# Patient Record
Sex: Female | Born: 1945 | Race: Black or African American | Hispanic: No | Smoking: Former smoker
Health system: Southern US, Community
[De-identification: ages and names within clinical notes are randomized; demographics above are authoritative.]

## PROBLEM LIST (undated history)

## (undated) DIAGNOSIS — I5030 Unspecified diastolic (congestive) heart failure: Secondary | ICD-10-CM

## (undated) DIAGNOSIS — K219 Gastro-esophageal reflux disease without esophagitis: Secondary | ICD-10-CM

## (undated) DIAGNOSIS — I10 Essential (primary) hypertension: Secondary | ICD-10-CM

## (undated) DIAGNOSIS — J449 Chronic obstructive pulmonary disease, unspecified: Secondary | ICD-10-CM

## (undated) DIAGNOSIS — IMO0001 Reserved for inherently not codable concepts without codable children: Secondary | ICD-10-CM

## (undated) DIAGNOSIS — I272 Pulmonary hypertension, unspecified: Secondary | ICD-10-CM

## (undated) DIAGNOSIS — I219 Acute myocardial infarction, unspecified: Secondary | ICD-10-CM

## (undated) DIAGNOSIS — M1990 Unspecified osteoarthritis, unspecified site: Secondary | ICD-10-CM

## (undated) DIAGNOSIS — W3400XA Accidental discharge from unspecified firearms or gun, initial encounter: Secondary | ICD-10-CM

## (undated) HISTORY — PX: BACK SURGERY: SHX140

## (undated) HISTORY — PX: EYE SURGERY: SHX253

## (undated) HISTORY — PX: CORONARY ANGIOPLASTY: SHX604

---

## 2013-12-03 ENCOUNTER — Inpatient Hospital Stay (HOSPITAL_COMMUNITY)
Admission: AD | Admit: 2013-12-03 | Discharge: 2013-12-09 | DRG: 371 | Disposition: A | Discharge status: Home Health | Payer: Medicare Other | Source: Other Acute Inpatient Hospital | Attending: Internal Medicine | Admitting: Internal Medicine

## 2013-12-03 DIAGNOSIS — D696 Thrombocytopenia, unspecified: Secondary | ICD-10-CM | POA: Diagnosis present

## 2013-12-03 DIAGNOSIS — Z9981 Dependence on supplemental oxygen: Secondary | ICD-10-CM

## 2013-12-03 DIAGNOSIS — J984 Other disorders of lung: Secondary | ICD-10-CM | POA: Diagnosis present

## 2013-12-03 DIAGNOSIS — K219 Gastro-esophageal reflux disease without esophagitis: Secondary | ICD-10-CM | POA: Diagnosis present

## 2013-12-03 DIAGNOSIS — R197 Diarrhea, unspecified: Secondary | ICD-10-CM | POA: Diagnosis not present

## 2013-12-03 DIAGNOSIS — K8080 Other cholelithiasis without obstruction: Secondary | ICD-10-CM | POA: Diagnosis present

## 2013-12-03 DIAGNOSIS — R1011 Right upper quadrant pain: Secondary | ICD-10-CM | POA: Diagnosis present

## 2013-12-03 DIAGNOSIS — I248 Other forms of acute ischemic heart disease: Secondary | ICD-10-CM | POA: Diagnosis present

## 2013-12-03 DIAGNOSIS — I5030 Unspecified diastolic (congestive) heart failure: Secondary | ICD-10-CM | POA: Diagnosis present

## 2013-12-03 DIAGNOSIS — J9611 Chronic respiratory failure with hypoxia: Secondary | ICD-10-CM

## 2013-12-03 DIAGNOSIS — I420 Dilated cardiomyopathy: Secondary | ICD-10-CM | POA: Diagnosis present

## 2013-12-03 DIAGNOSIS — J9622 Acute and chronic respiratory failure with hypercapnia: Secondary | ICD-10-CM | POA: Diagnosis present

## 2013-12-03 DIAGNOSIS — I252 Old myocardial infarction: Secondary | ICD-10-CM

## 2013-12-03 DIAGNOSIS — N189 Chronic kidney disease, unspecified: Secondary | ICD-10-CM | POA: Diagnosis present

## 2013-12-03 DIAGNOSIS — E872 Acidosis: Secondary | ICD-10-CM | POA: Diagnosis present

## 2013-12-03 DIAGNOSIS — Z79899 Other long term (current) drug therapy: Secondary | ICD-10-CM | POA: Diagnosis not present

## 2013-12-03 DIAGNOSIS — R0689 Other abnormalities of breathing: Secondary | ICD-10-CM | POA: Clinically undetermined

## 2013-12-03 DIAGNOSIS — I272 Other secondary pulmonary hypertension: Secondary | ICD-10-CM | POA: Diagnosis present

## 2013-12-03 DIAGNOSIS — N179 Acute kidney failure, unspecified: Secondary | ICD-10-CM | POA: Diagnosis present

## 2013-12-03 DIAGNOSIS — M19072 Primary osteoarthritis, left ankle and foot: Secondary | ICD-10-CM | POA: Diagnosis present

## 2013-12-03 DIAGNOSIS — J441 Chronic obstructive pulmonary disease with (acute) exacerbation: Secondary | ICD-10-CM | POA: Diagnosis present

## 2013-12-03 DIAGNOSIS — Z7982 Long term (current) use of aspirin: Secondary | ICD-10-CM

## 2013-12-03 DIAGNOSIS — I129 Hypertensive chronic kidney disease with stage 1 through stage 4 chronic kidney disease, or unspecified chronic kidney disease: Secondary | ICD-10-CM | POA: Diagnosis present

## 2013-12-03 DIAGNOSIS — I313 Pericardial effusion (noninflammatory): Secondary | ICD-10-CM | POA: Diagnosis present

## 2013-12-03 DIAGNOSIS — J810 Acute pulmonary edema: Secondary | ICD-10-CM | POA: Diagnosis present

## 2013-12-03 DIAGNOSIS — K8050 Calculus of bile duct without cholangitis or cholecystitis without obstruction: Secondary | ICD-10-CM | POA: Diagnosis present

## 2013-12-03 DIAGNOSIS — A047 Enterocolitis due to Clostridium difficile: Secondary | ICD-10-CM | POA: Diagnosis present

## 2013-12-03 DIAGNOSIS — Z6841 Body Mass Index (BMI) 40.0 and over, adult: Secondary | ICD-10-CM

## 2013-12-03 DIAGNOSIS — M109 Gout, unspecified: Secondary | ICD-10-CM | POA: Diagnosis present

## 2013-12-03 DIAGNOSIS — I2510 Atherosclerotic heart disease of native coronary artery without angina pectoris: Secondary | ICD-10-CM | POA: Diagnosis present

## 2013-12-03 DIAGNOSIS — I509 Heart failure, unspecified: Secondary | ICD-10-CM

## 2013-12-03 DIAGNOSIS — Z87891 Personal history of nicotine dependence: Secondary | ICD-10-CM

## 2013-12-03 DIAGNOSIS — J9621 Acute and chronic respiratory failure with hypoxia: Secondary | ICD-10-CM | POA: Diagnosis present

## 2013-12-03 DIAGNOSIS — R52 Pain, unspecified: Secondary | ICD-10-CM

## 2013-12-03 DIAGNOSIS — I3139 Other pericardial effusion (noninflammatory): Secondary | ICD-10-CM | POA: Diagnosis present

## 2013-12-03 DIAGNOSIS — Z9861 Coronary angioplasty status: Secondary | ICD-10-CM | POA: Diagnosis not present

## 2013-12-03 DIAGNOSIS — E875 Hyperkalemia: Secondary | ICD-10-CM | POA: Diagnosis not present

## 2013-12-03 DIAGNOSIS — M19071 Primary osteoarthritis, right ankle and foot: Secondary | ICD-10-CM | POA: Diagnosis present

## 2013-12-03 DIAGNOSIS — J449 Chronic obstructive pulmonary disease, unspecified: Secondary | ICD-10-CM | POA: Diagnosis present

## 2013-12-03 DIAGNOSIS — Z7951 Long term (current) use of inhaled steroids: Secondary | ICD-10-CM

## 2013-12-03 DIAGNOSIS — R9431 Abnormal electrocardiogram [ECG] [EKG]: Secondary | ICD-10-CM

## 2013-12-03 DIAGNOSIS — J811 Chronic pulmonary edema: Secondary | ICD-10-CM | POA: Diagnosis present

## 2013-12-03 DIAGNOSIS — A0472 Enterocolitis due to Clostridium difficile, not specified as recurrent: Secondary | ICD-10-CM | POA: Diagnosis present

## 2013-12-03 DIAGNOSIS — M25579 Pain in unspecified ankle and joints of unspecified foot: Secondary | ICD-10-CM | POA: Diagnosis present

## 2013-12-03 DIAGNOSIS — K8000 Calculus of gallbladder with acute cholecystitis without obstruction: Secondary | ICD-10-CM

## 2013-12-03 HISTORY — DX: Essential (primary) hypertension: I10

## 2013-12-03 HISTORY — DX: Acute myocardial infarction, unspecified: I21.9

## 2013-12-03 HISTORY — DX: Pulmonary hypertension, unspecified: I27.20

## 2013-12-03 HISTORY — DX: Reserved for inherently not codable concepts without codable children: IMO0001

## 2013-12-03 HISTORY — DX: Accidental discharge from unspecified firearms or gun, initial encounter: W34.00XA

## 2013-12-03 HISTORY — DX: Unspecified diastolic (congestive) heart failure: I50.30

## 2013-12-03 HISTORY — DX: Gastro-esophageal reflux disease without esophagitis: K21.9

## 2013-12-03 HISTORY — DX: Chronic obstructive pulmonary disease, unspecified: J44.9

## 2013-12-03 HISTORY — DX: Unspecified osteoarthritis, unspecified site: M19.90

## 2013-12-03 NOTE — H&P (Signed)
[facility] History and Physical  Angela PomfretMary Jane Baldwin ZOX:[id number] DOB: Jun 04, 1945 DOA: 12/03/2013  Referring physician: Bridgett LarssonKatie Imhof, MD PCP: Frances FurbishHALL,DANIEL, MD   Chief Complaint: Abdominal Pain  HPI: Angela CodeMary Jane Baldwin is a 68 y.o. female presents as a transfer for abdominal pain. Patient states that she has been having pain in her abdomen since last week. Patient states that associated with this there is diarrhea. 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 She states that the pain is located in the right upper quadrant area. She states that she has no fevers noted. She states that she has no chest pain noted. [facility] she was noted to have elevated troponin levels. In addition she has a history of CAD. As noted she has no chest pain at this time. She also admits a history of COPD and she is on oxygen at home. Patient was on 5lpm flow and has SaO2 of 89-90%. She is hemodynamically stable at this time. On her initial workup it appears that she has gallstones and cholecystitis.   Review of Systems:  Constitutional:  No weight loss, night sweats, Fevers, chills, fatigue.  HEENT:  No headaches, Difficulty swallowing,Tooth/dental problems,Sore throat  Cardio-vascular:  No chest pain, Orthopnea, PND, swelling in lower extremities, anasarca  GI:  No heartburn, indigestion, ++abdominal pain, ++nausea, ++vomiting, ++diarrhea Resp:  ++shortness of breath with exertion. no productive cough, No coughing up of blood.No change in color of mucus Skin:  no rash or lesions GU:  no dysuria, change in color of urine, no urgency or frequency.  Musculoskeletal:  No joint pain or swelling. No decreased range of motion.  Psych:  No change in mood or affect. No depression or anxiety.   No past medical history on file. No past surgical history on file. Social History:  has no tobacco, alcohol, and drug history on file.  No Known Allergies  No family  history on file.   Prior to Admission medications   Not on File   Physical Exam: Filed Vitals:   12/03/13 2300  BP: 109/69  Pulse: 85  Resp: 21  SpO2: 88%    Wt Readings from Last 3 Encounters:  No data found for Wt    General:  Appears calm and comfortable Eyes: PERRL, normal lids, irises & conjunctiva ENT: grossly normal hearing, lips & tongue Neck: no LAD, masses or thyromegaly Cardiovascular: RRR, no m/r/g. No LE edema. Telemetry: SR, no arrhythmias  Respiratory: CTA bilaterally, no w/r/r. Normal respiratory effort. Abdomen: soft, RUQ pain noted Skin: no rash or induration seen on limited exam Musculoskeletal: grossly normal tone BUE/BLE Psychiatric: grossly normal mood and affect, speech fluent and appropriate Neurologic: grossly non-focal.          Labs on Admission:  Basic Metabolic Panel: No results for input(s): NA, K, CL, CO2, GLUCOSE, BUN, CREATININE, CALCIUM, MG, PHOS in the last 168 hours. Liver Function Tests: No results for input(s): AST, ALT, ALKPHOS, BILITOT, PROT, ALBUMIN in the last 168 hours. No results for input(s): LIPASE, AMYLASE in the last 168 hours. No results for input(s): AMMONIA in the last 168 hours. CBC: No results for input(s): WBC, NEUTROABS, HGB, HCT, MCV, PLT in the last 168 hours. Cardiac Enzymes: No results for input(s): CKTOTAL, CKMB, CKMBINDEX, TROPONINI in the last 168 hours.  BNP (last 3 results) No results for input(s): PROBNP in the last 8760 hours. CBG: No results for input(s): GLUCAP in the last 168 hours.  Radiological Exams on Admission: No results found.    Assessment/Plan Active Problems:   Acute calculous cholecystitis   Chronic respiratory failure   Obstructive chronic bronchitis without exacerbation   Coronary atherosclerosis of native coronary artery   1. Acute cholecystitis -will admit to step down -will start on zosyn -will need GI consultation to evaluate -pain control  2. Chronic respiratory  Failure -she will be continued on oxygen therapy -will get ABG as needed -no indication for ventilatory support  3. COPD -continue with nebulizers at this time -oxygen therapy ordered  4. NSTEMI by enzymes -will monitro serial enzymes -cardiology consult -ASA ordered  5. AKI -will hydrate -monitor labs   Baldwin Status: Full Baldwin (must indicate Baldwin status--if unknown or must be presumed, indicate so) DVT Prophylaxis:heparin Family Communication: None (indicate person spoken with, if applicable, with phone number if by telephone) Disposition Plan: Home (indicate anticipated LOS)  Time spent: 70min  [facility] BossierKHAN,Yitta Gongaware A [facility] Pager [phone]

## 2013-12-04 ENCOUNTER — Inpatient Hospital Stay (HOSPITAL_COMMUNITY): Payer: Medicare Other

## 2013-12-04 ENCOUNTER — Encounter (HOSPITAL_COMMUNITY): Payer: Self-pay | Admitting: *Deleted

## 2013-12-04 DIAGNOSIS — I3139 Other pericardial effusion (noninflammatory): Secondary | ICD-10-CM | POA: Diagnosis present

## 2013-12-04 DIAGNOSIS — N179 Acute kidney failure, unspecified: Secondary | ICD-10-CM | POA: Diagnosis present

## 2013-12-04 DIAGNOSIS — R0689 Other abnormalities of breathing: Secondary | ICD-10-CM | POA: Clinically undetermined

## 2013-12-04 DIAGNOSIS — I319 Disease of pericardium, unspecified: Secondary | ICD-10-CM

## 2013-12-04 DIAGNOSIS — I313 Pericardial effusion (noninflammatory): Secondary | ICD-10-CM | POA: Diagnosis present

## 2013-12-04 DIAGNOSIS — I248 Other forms of acute ischemic heart disease: Secondary | ICD-10-CM | POA: Diagnosis present

## 2013-12-04 DIAGNOSIS — J811 Chronic pulmonary edema: Secondary | ICD-10-CM | POA: Diagnosis present

## 2013-12-04 DIAGNOSIS — I272 Pulmonary hypertension, unspecified: Secondary | ICD-10-CM | POA: Diagnosis present

## 2013-12-04 DIAGNOSIS — R197 Diarrhea, unspecified: Secondary | ICD-10-CM | POA: Diagnosis not present

## 2013-12-04 LAB — MRSA PCR SCREENING: MRSA by PCR: NEGATIVE

## 2013-12-04 LAB — COMPREHENSIVE METABOLIC PANEL
ALT: 54 U/L — ABNORMAL HIGH (ref 0–35)
ALT: 57 U/L — ABNORMAL HIGH (ref 0–35)
AST: 61 U/L — ABNORMAL HIGH (ref 0–37)
AST: 64 U/L — AB (ref 0–37)
Albumin: 2.7 g/dL — ABNORMAL LOW (ref 3.5–5.2)
Albumin: 2.8 g/dL — ABNORMAL LOW (ref 3.5–5.2)
Alkaline Phosphatase: 92 U/L (ref 39–117)
Alkaline Phosphatase: 95 U/L (ref 39–117)
Anion gap: 13 (ref 5–15)
Anion gap: 11 (ref 5–15)
BILIRUBIN TOTAL: 1 mg/dL (ref 0.3–1.2)
BILIRUBIN TOTAL: 1 mg/dL (ref 0.3–1.2)
BUN: 40 mg/dL — ABNORMAL HIGH (ref 6–23)
BUN: 38 mg/dL — ABNORMAL HIGH (ref 6–23)
CHLORIDE: 103 meq/L (ref 96–112)
CHLORIDE: 100 meq/L (ref 96–112)
CO2: 28 meq/L (ref 19–32)
CO2: 31 mEq/L (ref 19–32)
CREATININE: 1.21 mg/dL — AB (ref 0.50–1.10)
Calcium: 8.6 mg/dL (ref 8.4–10.5)
Calcium: 8.8 mg/dL (ref 8.4–10.5)
Creatinine, Ser: 1.31 mg/dL — ABNORMAL HIGH (ref 0.50–1.10)
GFR calc Af Amer: 47 mL/min — ABNORMAL LOW (ref >90)
GFR calc Af Amer: 51 mL/min — ABNORMAL LOW (ref >90)
GFR calc non Af Amer: 40 mL/min — ABNORMAL LOW (ref >90)
GFR calc non Af Amer: 44 mL/min — ABNORMAL LOW (ref >90)
Glucose, Bld: 79 mg/dL (ref 70–99)
Glucose, Bld: 100 mg/dL — ABNORMAL HIGH (ref 70–99)
Potassium: 4.8 mEq/L (ref 3.7–5.3)
Potassium: 4 mEq/L (ref 3.7–5.3)
SODIUM: 144 meq/L (ref 137–147)
Sodium: 142 mEq/L (ref 137–147)
TOTAL PROTEIN: 6.3 g/dL (ref 6.0–8.3)
Total Protein: 6 g/dL (ref 6.0–8.3)

## 2013-12-04 LAB — CBC WITH DIFFERENTIAL/PLATELET
BASOS ABS: 0 10*3/uL (ref 0.0–0.1)
Basophils Relative: 0 % (ref 0–1)
EOS ABS: 0.5 10*3/uL (ref 0.0–0.7)
Eosinophils Relative: 7 % — ABNORMAL HIGH (ref 0–5)
HCT: 51.1 % — ABNORMAL HIGH (ref 36.0–46.0)
Hemoglobin: 15.9 g/dL — ABNORMAL HIGH (ref 12.0–15.0)
Lymphocytes Relative: 19 % (ref 12–46)
Lymphs Abs: 1.3 10*3/uL (ref 0.7–4.0)
MCH: 28.1 pg (ref 26.0–34.0)
MCHC: 31.1 g/dL (ref 30.0–36.0)
MCV: 90.3 fL (ref 78.0–100.0)
Monocytes Absolute: 0.7 10*3/uL (ref 0.1–1.0)
Monocytes Relative: 10 % (ref 3–12)
NEUTROS ABS: 4.6 10*3/uL (ref 1.7–7.7)
NEUTROS PCT: 64 % (ref 43–77)
Platelets: 112 10*3/uL — ABNORMAL LOW (ref 150–400)
RBC: 5.66 MIL/uL — ABNORMAL HIGH (ref 3.87–5.11)
RDW: 17.4 % — AB (ref 11.5–15.5)
WBC: 7.1 10*3/uL (ref 4.0–10.5)

## 2013-12-04 LAB — PRO B NATRIURETIC PEPTIDE: PRO B NATRI PEPTIDE: 2719 pg/mL — AB (ref 0–125)

## 2013-12-04 LAB — POCT I-STAT 3, ART BLOOD GAS (G3+)
Acid-Base Excess: 2 mmol/L (ref 0.0–2.0)
Bicarbonate: 32.6 mEq/L — ABNORMAL HIGH (ref 20.0–24.0)
O2 Saturation: 86 %
PCO2 ART: 71.3 mmHg — AB (ref 35.0–45.0)
PH ART: 7.268 — AB (ref 7.350–7.450)
TCO2: 35 mmol/L (ref 0–100)
pO2, Arterial: 62 mmHg — ABNORMAL LOW (ref 80.0–100.0)

## 2013-12-04 LAB — CBC
HCT: 52.1 % — ABNORMAL HIGH (ref 36.0–46.0)
HCT: 51 % — ABNORMAL HIGH (ref 36.0–46.0)
HEMOGLOBIN: 16.8 g/dL — AB (ref 12.0–15.0)
Hemoglobin: 16.1 g/dL — ABNORMAL HIGH (ref 12.0–15.0)
MCH: 29.4 pg (ref 26.0–34.0)
MCH: 28.3 pg (ref 26.0–34.0)
MCHC: 32.2 g/dL (ref 30.0–36.0)
MCHC: 31.6 g/dL (ref 30.0–36.0)
MCV: 91.1 fL (ref 78.0–100.0)
MCV: 89.8 fL (ref 78.0–100.0)
PLATELETS: 112 10*3/uL — AB (ref 150–400)
Platelets: 108 10*3/uL — ABNORMAL LOW (ref 150–400)
RBC: 5.72 MIL/uL — ABNORMAL HIGH (ref 3.87–5.11)
RBC: 5.68 MIL/uL — ABNORMAL HIGH (ref 3.87–5.11)
RDW: 17.5 % — AB (ref 11.5–15.5)
RDW: 17.6 % — ABNORMAL HIGH (ref 11.5–15.5)
WBC: 8.1 10*3/uL (ref 4.0–10.5)
WBC: 8.3 10*3/uL (ref 4.0–10.5)

## 2013-12-04 LAB — URINALYSIS, ROUTINE W REFLEX MICROSCOPIC
BILIRUBIN URINE: NEGATIVE
GLUCOSE, UA: NEGATIVE mg/dL
HGB URINE DIPSTICK: NEGATIVE
Ketones, ur: NEGATIVE mg/dL
Nitrite: NEGATIVE
PH: 5 (ref 5.0–8.0)
Protein, ur: NEGATIVE mg/dL
SPECIFIC GRAVITY, URINE: 1.01 (ref 1.005–1.030)
Urobilinogen, UA: 0.2 mg/dL (ref 0.0–1.0)

## 2013-12-04 LAB — LACTIC ACID, PLASMA: LACTIC ACID, VENOUS: 1.1 mmol/L (ref 0.5–2.2)

## 2013-12-04 LAB — LIPASE, BLOOD: LIPASE: 35 U/L (ref 11–59)

## 2013-12-04 LAB — URINE MICROSCOPIC-ADD ON

## 2013-12-04 LAB — STREP PNEUMONIAE URINARY ANTIGEN: STREP PNEUMO URINARY ANTIGEN: NEGATIVE

## 2013-12-04 LAB — TSH: TSH: 1.75 u[IU]/mL (ref 0.350–4.500)

## 2013-12-04 LAB — CREATININE, SERUM
CREATININE: 1.36 mg/dL — AB (ref 0.50–1.10)
GFR calc Af Amer: 45 mL/min — ABNORMAL LOW (ref >90)
GFR, EST NON AFRICAN AMERICAN: 38 mL/min — AB (ref >90)

## 2013-12-04 LAB — HEMOGLOBIN A1C
Hgb A1c MFr Bld: 6 % — ABNORMAL HIGH (ref <5.7)
MEAN PLASMA GLUCOSE: 126 mg/dL — AB (ref <117)

## 2013-12-04 LAB — TROPONIN I
TROPONIN I: 0.32 ng/mL — AB (ref <0.30)
Troponin I: 0.31 ng/mL (ref <0.30)
Troponin I: <0.3 ng/mL (ref <0.30)

## 2013-12-04 LAB — PROCALCITONIN: PROCALCITONIN: 0.22 ng/mL

## 2013-12-04 MED ORDER — ONDANSETRON HCL 4 MG/2ML IJ SOLN: 4.0000 mg | Freq: Four times a day (QID) | INTRAMUSCULAR | Status: DC | PRN

## 2013-12-04 MED ORDER — MORPHINE SULFATE 2 MG/ML IJ SOLN: 1.0000 mg | INTRAMUSCULAR | Status: DC | PRN

## 2013-12-04 MED ORDER — TECHNETIUM TC 99M MEBROFENIN IV KIT
5.0000 | PACK | Freq: Once | INTRAVENOUS | Status: AC | PRN
  Administered 2013-12-04: 5 via INTRAVENOUS

## 2013-12-04 MED ORDER — ASPIRIN EC 325 MG PO TBEC
325.0000 mg | DELAYED_RELEASE_TABLET | Freq: Every day | ORAL | Status: DC
  Administered 2013-12-04 – 2013-12-09 (×5): 325 mg via ORAL
  Filled 2013-12-04 (×6): qty 1

## 2013-12-04 MED ORDER — VITAMIN B-1 100 MG PO TABS
100.0000 mg | ORAL_TABLET | Freq: Every day | ORAL | Status: DC
  Administered 2013-12-04 – 2013-12-09 (×5): 100 mg via ORAL
  Filled 2013-12-04 (×6): qty 1

## 2013-12-04 MED ORDER — IPRATROPIUM BROMIDE 0.02 % IN SOLN
0.5000 mg | Freq: Four times a day (QID) | RESPIRATORY_TRACT | Status: DC
  Administered 2013-12-04 – 2013-12-09 (×19): 0.5 mg via RESPIRATORY_TRACT
  Filled 2013-12-04 (×20): qty 2.5

## 2013-12-04 MED ORDER — FUROSEMIDE 10 MG/ML IJ SOLN
60.0000 mg | Freq: Once | INTRAMUSCULAR | Status: AC
  Administered 2013-12-04: 60 mg via INTRAVENOUS
  Filled 2013-12-04: qty 6

## 2013-12-04 MED ORDER — ONDANSETRON HCL 4 MG PO TABS: 4.0000 mg | ORAL_TABLET | Freq: Four times a day (QID) | ORAL | Status: DC | PRN

## 2013-12-04 MED ORDER — ZOLPIDEM TARTRATE 5 MG PO TABS: 5.0000 mg | ORAL_TABLET | Freq: Every evening | ORAL | Status: DC | PRN

## 2013-12-04 MED ORDER — LEVALBUTEROL HCL 0.63 MG/3ML IN NEBU
0.6300 mg | INHALATION_SOLUTION | Freq: Four times a day (QID) | RESPIRATORY_TRACT | Status: DC
  Administered 2013-12-04 – 2013-12-09 (×17): 0.63 mg via RESPIRATORY_TRACT
  Filled 2013-12-04 (×38): qty 3

## 2013-12-04 MED ORDER — SODIUM CHLORIDE 0.9 % IV SOLN
INTRAVENOUS | Status: DC
  Administered 2013-12-04: 01:00:00 via INTRAVENOUS

## 2013-12-04 MED ORDER — BUDESONIDE-FORMOTEROL FUMARATE 160-4.5 MCG/ACT IN AERO
2.0000 | INHALATION_SPRAY | Freq: Two times a day (BID) | RESPIRATORY_TRACT | Status: DC
  Administered 2013-12-04 – 2013-12-09 (×9): 2 via RESPIRATORY_TRACT
  Filled 2013-12-04: qty 6

## 2013-12-04 MED ORDER — ACETAMINOPHEN 325 MG PO TABS
650.0000 mg | ORAL_TABLET | Freq: Four times a day (QID) | ORAL | Status: DC | PRN
  Administered 2013-12-08: 650 mg via ORAL
  Filled 2013-12-04: qty 2

## 2013-12-04 MED ORDER — HEPARIN SODIUM (PORCINE) 5000 UNIT/ML IJ SOLN
5000.0000 [IU] | Freq: Three times a day (TID) | INTRAMUSCULAR | Status: DC
  Administered 2013-12-04 – 2013-12-09 (×17): 5000 [IU] via SUBCUTANEOUS
  Filled 2013-12-04 (×20): qty 1

## 2013-12-04 MED ORDER — PIPERACILLIN-TAZOBACTAM 3.375 G IVPB
3.3750 g | Freq: Three times a day (TID) | INTRAVENOUS | Status: DC
  Administered 2013-12-04 – 2013-12-05 (×6): 3.375 g via INTRAVENOUS
  Filled 2013-12-04 (×7): qty 50

## 2013-12-04 MED ORDER — ALBUTEROL SULFATE (2.5 MG/3ML) 0.083% IN NEBU
2.5000 mg | INHALATION_SOLUTION | Freq: Four times a day (QID) | RESPIRATORY_TRACT | Status: DC
  Administered 2013-12-04 (×2): 2.5 mg via RESPIRATORY_TRACT
  Filled 2013-12-04 (×2): qty 3

## 2013-12-04 MED ORDER — SODIUM CHLORIDE 0.9 % IJ SOLN
3.0000 mL | Freq: Two times a day (BID) | INTRAMUSCULAR | Status: DC
  Administered 2013-12-04 – 2013-12-08 (×11): 3 mL via INTRAVENOUS

## 2013-12-04 MED ORDER — ADULT MULTIVITAMIN W/MINERALS CH
1.0000 | ORAL_TABLET | Freq: Every day | ORAL | Status: DC
  Administered 2013-12-04 – 2013-12-09 (×5): 1 via ORAL
  Filled 2013-12-04 (×6): qty 1

## 2013-12-04 MED ORDER — ACETAMINOPHEN 650 MG RE SUPP: 650.0000 mg | Freq: Four times a day (QID) | RECTAL | Status: DC | PRN

## 2013-12-04 MED ORDER — FOLIC ACID 1 MG PO TABS
1.0000 mg | ORAL_TABLET | Freq: Every day | ORAL | Status: DC
  Administered 2013-12-04 – 2013-12-09 (×5): 1 mg via ORAL
  Filled 2013-12-04 (×6): qty 1

## 2013-12-04 MED ORDER — VANCOMYCIN HCL 10 G IV SOLR
2500.0000 mg | Freq: Once | INTRAVENOUS | Status: AC
  Administered 2013-12-04: 2500 mg via INTRAVENOUS
  Filled 2013-12-04: qty 2500

## 2013-12-04 MED ORDER — VANCOMYCIN HCL 10 G IV SOLR
1500.0000 mg | INTRAVENOUS | Status: DC
  Administered 2013-12-05: 1500 mg via INTRAVENOUS
  Filled 2013-12-04: qty 1500

## 2013-12-04 MED ORDER — OXYCODONE HCL 5 MG PO TABS
5.0000 mg | ORAL_TABLET | ORAL | Status: DC | PRN
  Administered 2013-12-04 – 2013-12-09 (×10): 5 mg via ORAL
  Filled 2013-12-04 (×10): qty 1

## 2013-12-04 NOTE — Progress Notes (Signed)
Echocardiogram 2D Echocardiogram has been performed.  Angela Baldwin 12/04/2013, 3:24 PM
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
## 2013-12-04 NOTE — Progress Notes (Addendum)
Allison,NCP , aware of ABG results

## 2013-12-04 NOTE — Progress Notes (Signed)
[facility]  Angela Baldwin ZOX:[id number] DOB: 1945/11/15 DOA: 12/03/2013 PCP: Frances Furbish, MD   Brief narrative: 68 year old female patient who was transferred to [facility] from [facility] and [place]. She has COPD with chronic hypoxemia baseline oxygen 5 L/m which increases to 10 L/m when ambulatory. She presented to the outside hospital with complaints of abdominal pain primarily in the right upper quadrant with nausea. Duration least for 1 week. Apparent recent treatment for some sort of colitis that may have been C. difficile. CT scan at that outside facility demonstrated a contracted gallbladder, ultrasound completed at [facility]. facility revealed nonmobile stone in the neck of the gallbladder with mild wall thickening and pericholecystic fluid. She did not have fever or leukocytosis. She did have mild elevation in troponin which was concerning for possible demand ischemia. She also had azotemia with elevated creatinine with baseline renal function unknown; possibility of acute renal failure.  Upon arrival to [facility] she continued to have abdominal discomfort on exam. She was hemodynamically stable. O2 sats ration's were between 89 and 90% on 5 L oxygen. A repeat CBC was done which revealed a hemoglobin of 16 but a platelet count 108,000 which was lower than presenting platelet count at previous facility.  HPI/Subjective: Alert but noted with increased work of breathing. Patient reports she normally has his type of difficulty breathing when her COPD is "troubling her". She states her abdominal pain is better.  Assessment/Plan: Active Problems:   Acute and chronic respiratory failure with hypoxia/Hypercapnia:   A) COPD (chronic obstructive pulmonary disease   B) Pulmonary edema   C) Pulmonary HTN -On exam this a.m. primarily with crackles mid fields down with a few upper airway expiratory wheezes which are faint; doubt COPD  exacerbation-good air movement -Add Xopenex nebulizer and Symbicort MDI -A repeat chest x-ray completed at 7 AM today revealed congestive heart failure with pulmonary edema therefore IV fluids to keep open and one-time dose Lasix 60 mg IV given-monitoring response (see below regarding acute renal failure) -LVEF was normal on cardiac catheterization April 2015 -Has documented history of pulmonary hypertension so will check echo to see if has right heart failure -Chest x-ray findings could also be explained by noncardiogenic edema such as ARDS -Broaden antibiotic coverage: Continue Zosyn and add vancomycin -Procalcitonin 0.22 -ProBNP only mildly elevated at 2719 -Urinary strep negative, urinary Legionella antigen pending -ABG obtained this a.m. and demonstrated respiratory acidosis-BiPAP initiated    Acute calculous cholecystitis -Appreciate surgical assistance -Agree with HIDA scan-can pursue once respiratory status more stable -Appears to be a poor operative candidate therefore if HIDA positive for obstructive cholecystitis likely would need to pursue percutaneous cholecystostomy tube in interventional radiology -LFTs have improved since admission -Symptoms could possibly be related to passive hepatic congestion; has normal LVEF based on prior catheterization but if has significant right heart failure this could explain hepatic congestion -Lipase normal and no evidence of pancreatitis   Thrombocytopenia -New since presenting to this facility -Unclear if marker of evolving gram-negative sepsis    Demand ischemia -minimal elevation in troponin which has now decreased to normal -EKG nonischemic -Cardiac catheterization April 2015 with nonobstructive CAD    Pericardial effusion -Documented on CT scan and reported as increasing in size as compared to previous scan on 10/28/13 -Check Echocardiogram     Acute renal failure -Baseline renal function unknown -Labs from outside hospital with  BUN 50 creatinine 1.6 -BUN and creatinine have trended  downward after hydration noting today 38 and 1.21 -Unfortunately had to stop IV fluids in setting of presumed acute pulmonary edema-follow renal function closely    Diarrhea -No further diarrhea since admission -As precaution check C. difficile PCR   DVT prophylaxis: Subcutaneous heparin Code Status: Full Family Communication: No family at bedside Disposition Plan/Expected LOS: Remain in stepdown   Consultants: Gen. Surgery/Dr. Dwain SarnaWakefield  Procedures: 2-D echocardiogram pending  Cultures: Blood cultures 2 pending Urine culture pending  Antibiotics: Zosyn 12/1 > Vancomycin 12/2 >  Objective: Blood pressure 94/76, pulse 81, temperature 97.7 F (36.5 C), temperature source Oral, resp. rate 15, height 5\' 2"  (1.575 m), weight 254 lb 6.6 oz (115.4 kg), SpO2 88 %.  Intake/Output Summary (Last 24 hours) at 12/04/13 1422 Last data filed at 12/04/13 1400  Gross per 24 hour  Intake   1200 ml  Output   2325 ml  Net  -1125 ml     Exam: Gen: Moderate acute respiratory distress as evidenced by increased work of breathing, tachypnea and difficulty speaking Chest: Bilateral crackles primarily mid fields down, upper airways with bilateral faint expiratory wheezing, good airway movement, 5 L nasal cannula transitioned to BiPAP after ABG resulted Cardiac: Regular rate and rhythm, S1-S2, no rubs murmurs or gallops, 2+ bilateral lower extremity peripheral edema, no JVD Abdomen: Soft nontender nondistended without obvious hepatosplenomegaly, no ascites Extremities: Symmetrical in appearance without cyanosis, clubbing or effusion  Scheduled Meds:  Scheduled Meds: . aspirin EC  325 mg Oral Daily  . budesonide-formoterol  2 puff Inhalation BID  . folic acid  1 mg Oral Daily  . heparin  5,000 Units Subcutaneous 3 times per day  . ipratropium  0.5 mg Nebulization Q6H  . levalbuterol  0.63 mg Nebulization Q6H  . multivitamin with  minerals  1 tablet Oral Daily  . piperacillin-tazobactam (ZOSYN)  IV  3.375 g Intravenous Q8H  . sodium chloride  3 mL Intravenous Q12H  . thiamine  100 mg Oral Daily  . [START ON 12/05/2013] vancomycin  1,500 mg Intravenous Q24H   Continuous Infusions: . sodium chloride 10 mL/hr at 12/04/13 0900    Data Reviewed: Basic Metabolic Panel:  Recent Labs Lab 12/04/13 0250 12/04/13 0745 12/04/13 0940  NA  --  144 142  K  --  4.8 4.0  CL  --  103 100  CO2  --  28 31  GLUCOSE  --  79 100*  BUN  --  40* 38*  CREATININE 1.36* 1.31* 1.21*  CALCIUM  --  8.6 8.8   Liver Function Tests:  Recent Labs Lab 12/04/13 0745 12/04/13 0940  AST 61* 64*  ALT 54* 57*  ALKPHOS 92 95  BILITOT 1.0 1.0  PROT 6.0 6.3  ALBUMIN 2.7* 2.8*    Recent Labs Lab 12/04/13 0940  LIPASE 35   No results for input(s): AMMONIA in the last 168 hours. CBC:  Recent Labs Lab 12/04/13 0250 12/04/13 0745 12/04/13 0940  WBC 8.1 8.3 7.1  NEUTROABS  --   --  4.6  HGB 16.8* 16.1* 15.9*  HCT 52.1* 51.0* 51.1*  MCV 91.1 89.8 90.3  PLT 108* 112* 112*   Cardiac Enzymes:  Recent Labs Lab 12/04/13 0250 12/04/13 0745 12/04/13 1248  TROPONINI 0.32* 0.31* <0.30   BNP (last 3 results)  Recent Labs  12/04/13 0940  PROBNP 2719.0*   CBG: No results for input(s): GLUCAP in the last 168 hours.  Recent Results (from the past 240 hour(s))  MRSA PCR Screening  Status: None   Collection Time: 12/03/13 11:06 PM  Result Value Ref Range Status   MRSA by PCR NEGATIVE NEGATIVE Final    Comment:        The GeneXpert MRSA Assay (FDA approved for NASAL specimens only), is one component of a comprehensive MRSA colonization surveillance program. It is not intended to diagnose MRSA infection nor to guide or monitor treatment for MRSA infections.      Studies:  Recent x-ray studies have been reviewed in detail by the Attending Physician  Time spent :      Junious Silkllison Ellis, ANP [facility]  [phone] Pager [phone]  I have seen and examined the patient , reviewed plan of care as documented by APP.   Richarda Overlieayana Rhyatt Muska [phone]      **If unable to reach the above provider after paging please contact the Flow Manager @ [phone]  On-Call/Text Page:      [email or web address]      password [email or web address]  If 7PM-7AM, please contact night-coverage [email or web address] Password [email or web address] 12/04/2013, 2:22 PM   LOS: 1 day

## 2013-12-04 NOTE — Care Management Note (Signed)
    Page 1 of 1   12/04/2013     9:59:34 AM CARE MANAGEMENT NOTE 12/04/2013  Patient:  Angela Baldwin,Angela Baldwin   Account Number:  [id number]  Date Initiated:  12/04/2013  Documentation initiated by:  Junius CreamerWELL,DEBBIE  Subjective/Objective Assessment:   adm w cholecystitis, hypoxia     Action/Plan:   lives alone, pcp dr Reuel Boomdaniel hall, has aide per chart, uses home o2 per chart   Anticipated DC Date:  12/07/2013   Anticipated DC Plan:  HOME/SELF CARE         Choice offered to / List presented to:             Status of service:   Medicare Important Message given?   (If response is "NO", the following Medicare IM given date fields will be blank) Date Medicare IM given:   Medicare IM given by:   Date Additional Medicare IM given:   Additional Medicare IM given by:    Discharge Disposition:    Per UR Regulation:  Reviewed for med. necessity/level of care/duration of stay  If discussed at Long Length of Stay Meetings, dates discussed:    Comments:

## 2013-12-04 NOTE — Progress Notes (Signed)
Patient is in no respiratory distress and BiPap is not needed at this time.  RT will continue to monitor progress.

## 2013-12-04 NOTE — Progress Notes (Signed)
ANTIBIOTIC CONSULT NOTE - INITIAL  Pharmacy Consult for Vancomycin Indication: Acute cholecystitis  No Known Allergies  Patient Measurements: Height: 5\' 2"  (157.5 cm) Weight: 254 lb 6.6 oz (115.4 kg) IBW/kg (Calculated) : 50.1  Vital Signs: Temp: 98.3 F (36.8 C) (12/02 0800) Temp Source: Oral (12/02 0809) BP: 115/64 mmHg (12/02 0800) Pulse Rate: 82 (12/02 0809) Intake/Output from previous day: 12/01 0701 - 12/02 0700 In: 650 [P.O.:150; I.V.:450; IV Piggyback:50] Out: 575 [Urine:575] Intake/Output from this shift:    Labs:  Recent Labs  12/04/13 0250 12/04/13 0745  WBC 8.1 8.3  HGB 16.8* 16.1*  PLT 108* PENDING  CREATININE 1.36* 1.31*   Estimated Creatinine Clearance: 48.1 mL/min (by C-G formula based on Cr of 1.31). No results for input(s): VANCOTROUGH, VANCOPEAK, VANCORANDOM, GENTTROUGH, GENTPEAK, GENTRANDOM, TOBRATROUGH, TOBRAPEAK, TOBRARND, AMIKACINPEAK, AMIKACINTROU, AMIKACIN in the last 72 hours.   Microbiology: Recent Results (from the past 720 hour(s))  MRSA PCR Screening     Status: None   Collection Time: 12/03/13 11:06 PM  Result Value Ref Range Status   MRSA by PCR NEGATIVE NEGATIVE Final    Comment:        The GeneXpert MRSA Assay (FDA approved for NASAL specimens only), is one component of a comprehensive MRSA colonization surveillance program. It is not intended to diagnose MRSA infection nor to guide or monitor treatment for MRSA infections.     Medical History: Past Medical History  Diagnosis Date  . Myocardial infarction   . Hypertension   . COPD (chronic obstructive pulmonary disease)   . Shortness of breath dyspnea   . GERD (gastroesophageal reflux disease)   . Arthritis    Medications:  Scheduled:  . aspirin EC  325 mg Oral Daily  . budesonide-formoterol  2 puff Inhalation BID  . folic acid  1 mg Oral Daily  . furosemide  60 mg Intravenous Once  . heparin  5,000 Units Subcutaneous 3 times per day  . ipratropium  0.5 mg  Nebulization Q6H  . levalbuterol  0.63 mg Nebulization Q6H  . multivitamin with minerals  1 tablet Oral Daily  . piperacillin-tazobactam (ZOSYN)  IV  3.375 g Intravenous Q8H  . sodium chloride  3 mL Intravenous Q12H  . thiamine  100 mg Oral Daily  . [START ON 12/05/2013] vancomycin  1,500 mg Intravenous Q24H  . vancomycin  2,500 mg Intravenous Once   Infusions:  . sodium chloride 75 mL/hr at 12/04/13 0107   Assessment: 68 [facility] with abdominal pain.  Patient found to have acute cholecystitis, as well as in AKI.  Patient was started on zosyn this AM.  Pharmacy has been consulted to dose vancomycin.    Infectious Disease: Acute cholecystitis - WBC wnl, afeb  Zosyn 11/2 >> Vanc 11/2 >>  12/1 Bld x 2 >> C. Diff >>   Nephrology: Crcl ~45-50 mL/min, lytes wnl.    Goal of Therapy:  Vancomycin trough level 15-20 mcg/ml  Plan: - Initiate vancomycin 2500 mg x1 LD, 1500 mg q24h - F/U cultures, clinical course, trough levels when appropriate  Red ChristiansSamson Roby Donaway, Pharm. D. Clinical Pharmacy Resident Pager: [phone] Ph: [phone] 12/04/2013 10:35 AM

## 2013-12-04 NOTE — Plan of Care (Signed)
Problem: Consults Goal: General Medical Patient Education See Patient Education Module for specific education. Outcome: Progressing Goal: Skin Care Protocol Initiated - if Braden Score 18 or less If consults are not indicated, leave blank or document N/A Outcome: Completed/Met Date Met:  12/04/13 Goal: Nutrition Consult-if indicated Outcome: Progressing Goal: Diabetes Guidelines if Diabetic/Glucose > 140 If diabetic or lab glucose is > 140 mg/dl - Initiate Diabetes/Hyperglycemia Guidelines & Document Interventions  Outcome: Not Applicable Date Met:  22/48/25  Problem: Phase I Progression Outcomes Goal: Pain controlled with appropriate interventions Outcome: Progressing Goal: OOB as tolerated unless otherwise ordered Outcome: Progressing Goal: Initial discharge plan identified Outcome: Progressing Goal: Voiding-avoid urinary catheter unless indicated Outcome: Progressing Goal: Hemodynamically stable Outcome: Progressing

## 2013-12-05 ENCOUNTER — Encounter (HOSPITAL_COMMUNITY): Payer: Self-pay | Admitting: Cardiology

## 2013-12-05 DIAGNOSIS — I214 Non-ST elevation (NSTEMI) myocardial infarction: Secondary | ICD-10-CM

## 2013-12-05 DIAGNOSIS — J810 Acute pulmonary edema: Secondary | ICD-10-CM

## 2013-12-05 DIAGNOSIS — R0689 Other abnormalities of breathing: Secondary | ICD-10-CM

## 2013-12-05 DIAGNOSIS — I319 Disease of pericardium, unspecified: Secondary | ICD-10-CM

## 2013-12-05 DIAGNOSIS — I509 Heart failure, unspecified: Secondary | ICD-10-CM

## 2013-12-05 DIAGNOSIS — I248 Other forms of acute ischemic heart disease: Secondary | ICD-10-CM

## 2013-12-05 DIAGNOSIS — I270 Primary pulmonary hypertension: Secondary | ICD-10-CM

## 2013-12-05 DIAGNOSIS — J9611 Chronic respiratory failure with hypoxia: Secondary | ICD-10-CM | POA: Diagnosis present

## 2013-12-05 DIAGNOSIS — J438 Other emphysema: Secondary | ICD-10-CM

## 2013-12-05 DIAGNOSIS — J9621 Acute and chronic respiratory failure with hypoxia: Secondary | ICD-10-CM

## 2013-12-05 DIAGNOSIS — D696 Thrombocytopenia, unspecified: Secondary | ICD-10-CM

## 2013-12-05 LAB — COMPREHENSIVE METABOLIC PANEL
ALBUMIN: 2.8 g/dL — AB (ref 3.5–5.2)
ALT: 44 U/L — ABNORMAL HIGH (ref 0–35)
ANION GAP: 12 (ref 5–15)
AST: 41 U/L — ABNORMAL HIGH (ref 0–37)
Alkaline Phosphatase: 86 U/L (ref 39–117)
BUN: 27 mg/dL — ABNORMAL HIGH (ref 6–23)
CO2: 29 mEq/L (ref 19–32)
CREATININE: 1.04 mg/dL (ref 0.50–1.10)
Calcium: 8.9 mg/dL (ref 8.4–10.5)
Chloride: 100 mEq/L (ref 96–112)
GFR calc Af Amer: 62 mL/min — ABNORMAL LOW (ref >90)
GFR calc non Af Amer: 53 mL/min — ABNORMAL LOW (ref >90)
GLUCOSE: 118 mg/dL — AB (ref 70–99)
Potassium: 5.6 mEq/L — ABNORMAL HIGH (ref 3.7–5.3)
Sodium: 141 mEq/L (ref 137–147)
TOTAL PROTEIN: 6.3 g/dL (ref 6.0–8.3)
Total Bilirubin: 0.9 mg/dL (ref 0.3–1.2)

## 2013-12-05 LAB — URINE CULTURE
COLONY COUNT: NO GROWTH
Culture: NO GROWTH

## 2013-12-05 LAB — CBC WITH DIFFERENTIAL/PLATELET
Basophils Absolute: 0 10*3/uL (ref 0.0–0.1)
Basophils Relative: 0 % (ref 0–1)
EOS ABS: 0.4 10*3/uL (ref 0.0–0.7)
EOS PCT: 5 % (ref 0–5)
HEMATOCRIT: 50.1 % — AB (ref 36.0–46.0)
HEMOGLOBIN: 16 g/dL — AB (ref 12.0–15.0)
Lymphocytes Relative: 19 % (ref 12–46)
Lymphs Abs: 1.4 10*3/uL (ref 0.7–4.0)
MCH: 28.9 pg (ref 26.0–34.0)
MCHC: 31.9 g/dL (ref 30.0–36.0)
MCV: 90.4 fL (ref 78.0–100.0)
Monocytes Absolute: 0.9 10*3/uL (ref 0.1–1.0)
Monocytes Relative: 12 % (ref 3–12)
NEUTROS PCT: 64 % (ref 43–77)
Neutro Abs: 4.6 10*3/uL (ref 1.7–7.7)
Platelets: 113 10*3/uL — ABNORMAL LOW (ref 150–400)
RBC: 5.54 MIL/uL — AB (ref 3.87–5.11)
RDW: 17.7 % — ABNORMAL HIGH (ref 11.5–15.5)
WBC: 7.3 10*3/uL (ref 4.0–10.5)

## 2013-12-05 LAB — GLUCOSE, CAPILLARY: GLUCOSE-CAPILLARY: 99 mg/dL (ref 70–99)

## 2013-12-05 LAB — LEGIONELLA ANTIGEN, URINE

## 2013-12-05 LAB — MAGNESIUM: MAGNESIUM: 1.9 mg/dL (ref 1.5–2.5)

## 2013-12-05 MED ORDER — SODIUM POLYSTYRENE SULFONATE 15 GM/60ML PO SUSP
45.0000 g | Freq: Once | ORAL | Status: AC
  Administered 2013-12-05: 45 g via ORAL
  Filled 2013-12-05: qty 180

## 2013-12-05 MED ORDER — FUROSEMIDE 10 MG/ML IJ SOLN
40.0000 mg | Freq: Every day | INTRAMUSCULAR | Status: DC
  Administered 2013-12-05: 40 mg via INTRAVENOUS
  Filled 2013-12-05 (×2): qty 4

## 2013-12-05 MED ORDER — REGADENOSON 0.4 MG/5ML IV SOLN
0.4000 mg | Freq: Once | INTRAVENOUS | Status: DC
  Filled 2013-12-05: qty 5

## 2013-12-05 MED ORDER — INDOMETHACIN 50 MG PO CAPS
50.0000 mg | ORAL_CAPSULE | ORAL | Status: DC | PRN
  Administered 2013-12-05: 50 mg via ORAL
  Filled 2013-12-05 (×2): qty 1

## 2013-12-05 NOTE — Progress Notes (Signed)
[place] TEAM 1 - Stepdown/ICU TEAM Progress Note  Angela Baldwin ZOX:[id number] DOB: Apr 15, 1945 DOA: 12/03/2013 PCP: Frances Furbish, MD  Admit HPI / Brief Narrative: Alexica Schlossberg Salyers is a 68 y.o. BF PMHx presents as a transfer for abdominal pain. Patient states that she has been having pain in her abdomen since last week. Patient states that associated with this there is diarrhea. 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 She states that the pain is located in the right upper quadrant area. She states that she has no fevers noted. She states that she has no chest pain noted. In the ED at [facility] she was noted to have elevated troponin levels. In addition she has a history of CAD. As noted she has no chest pain at this time. She also admits a history of COPD and she is on oxygen at home. Patient was on 5lpm flow and has SaO2 of 89-90%. She is hemodynamically stable at this time. On her initial workup it appears that she has gallstones and cholecystitis.   HPI/Subjective: 12/3 A/O 4, sitting in chair comfortably  Assessment/Plan: Acute and chronic respiratory failure with hypoxia/Hypercapnia: -Patient's appears at baseline able to carry on conversation without distress   COPD (chronic obstructive pulmonary disease -Continue Symbicort -Continue Xopenex QID -Continue ipratropium QID -Consult pulmonology for preop clearance; patient most likely will be difficult to extubate  Pulmonary edema/pleural effusion - Lasix IV 40 mg daily; will need to monitor closely secondary to borderline hypotension  Cardiomyopathy -Will start patient on Lasix 40 mg daily monitor closely -Cardiology consult pending  Pulmonary HTN -LVEF was normal on cardiac catheterization April 2015 -Has documented history of pulmonary hypertension so will check echo to see if has right heart failure -Chest x-ray findings could also be explained by noncardiogenic edema such as  ARDS -ProBNP only mildly elevated at 2719 -Urinary strep negative, urinary Legionella antigen negative: stop all antibiotics   Acute calculous cholecystitis -HIDA scan; suspicious for obstruction see results below. Surgery believes patient needs laparoscopic cholecystectomy. Consult to cardiology for clearance -Will also consult pulmonology for clearance given her lung disease -Lipase normal and no evidence of pancreatitis  Thrombocytopenia -New since presenting to this facility -Unclear if marker of evolving gram-negative sepsis   Demand ischemia -minimal elevation in troponin which has now decreased to normal -EKG nonischemic -Cardiac catheterization April 2015 with nonobstructive CAD   Pericardial effusion -Documented on CT scan and reported as increasing in size as compared to previous scan on 10/28/13 -Echocardiogram shows mild-moderate effusion, will await cardiology recommendations   Acute renal failure -Baseline renal function unknown -Labs from outside hospital with BUN 50 creatinine 1.6 -BUN and creatinine have trended downward after hydration noting today is 27  and 1.04 -Unfortunately had to stop IV fluids in setting of presumed acute pulmonary edema-follow renal function closely  Hyperkalemia -Kayexalate 45 g 1   Diarrhea -No further diarrhea since admission -DC isolation and stool sample, if patient began to have diarrhea again will obtain sample    DVT prophylaxis: Subcutaneous heparin Code Status: Full Family Communication: No family at bedside Disposition Plan/Expected LOS: Laparoscopic cholecystectomy?  Consultants: Cardiology pending   Procedure/Significant Events: 12/2 Echocardiogrm;Left ventricle:  mild LVH. LVEF=55%. - Right ventricle: severely dilated. Systolic function was severely reduced. - Right atrium: moderately dilated. - Pulmonary arteries: PA peak pressure: 72 mm Hg (S). - Pericardium, extracardiac: There is a small/moderate  circumferential pericardial effusion. 12/2 HIDA Scan:Distended gallbladder; Suspicious for  common bile duct obstruction without cystic duct obstruction.    Culture 12/2 blood left hand/forearm NGTD 12/2 urine pending  Antibiotics: Zosyn 12/2>> stopped 12/3 Vancomycin 12/3>>.stopped 12/3  DVT prophylaxis: Subcutaneous heparin   Devices   LINES / TUBES:      Continuous Infusions: . sodium chloride Stopped (12/05/13 0935)    Objective: VITAL SIGNS: Temp: 97.7 F (36.5 C) (12/03 1226) Temp Source: Oral (12/03 1226) BP: 112/71 mmHg (12/03 1226) Pulse Rate: 79 (12/03 1226) SPO2; 93% on 3 L O2 via [place] FIO2:   Intake/Output Summary (Last 24 hours) at 12/05/13 1243 Last data filed at 12/05/13 0935  Gross per 24 hour  Intake 975.83 ml  Output   2675 ml  Net -1699.17 ml     Exam: General: A/O 4, moderate chronic respiratory distress Lungs: Mild diffuse wheezing, with diffuse crackles Cardiovascular: Tachycardic, Regular rhythm without murmur gallop or rub normal S1 and S2 Abdomen: Morbidly obese, Nontender, nondistended, soft, bowel sounds positive, no rebound, no ascites, no appreciable mass Extremities: No significant cyanosis, clubbing, or edema bilateral lower extremities  Data Reviewed: Basic Metabolic Panel:  Recent Labs Lab 12/04/13 0250 12/04/13 0745 12/04/13 0940  NA  --  144 142  K  --  4.8 4.0  CL  --  103 100  CO2  --  28 31  GLUCOSE  --  79 100*  BUN  --  40* 38*  CREATININE 1.36* 1.31* 1.21*  CALCIUM  --  8.6 8.8   Liver Function Tests:  Recent Labs Lab 12/04/13 0745 12/04/13 0940  AST 61* 64*  ALT 54* 57*  ALKPHOS 92 95  BILITOT 1.0 1.0  PROT 6.0 6.3  ALBUMIN 2.7* 2.8*    Recent Labs Lab 12/04/13 0940  LIPASE 35   No results for input(s): AMMONIA in the last 168 hours. CBC:  Recent Labs Lab 12/04/13 0250 12/04/13 0745 12/04/13 0940  WBC 8.1 8.3 7.1  NEUTROABS  --   --  4.6  HGB 16.8* 16.1* 15.9*  HCT 52.1*  51.0* 51.1*  MCV 91.1 89.8 90.3  PLT 108* 112* 112*   Cardiac Enzymes:  Recent Labs Lab 12/04/13 0250 12/04/13 0745 12/04/13 1248  TROPONINI 0.32* 0.31* <0.30   BNP (last 3 results)  Recent Labs  12/04/13 0940  PROBNP 2719.0*   CBG:  Recent Labs Lab 12/05/13 0803  GLUCAP 99    Recent Results (from the past 240 hour(s))  MRSA PCR Screening     Status: None   Collection Time: 12/03/13 11:06 PM  Result Value Ref Range Status   MRSA by PCR NEGATIVE NEGATIVE Final    Comment:        The GeneXpert MRSA Assay (FDA approved for NASAL specimens only), is one component of a comprehensive MRSA colonization surveillance program. It is not intended to diagnose MRSA infection nor to guide or monitor treatment for MRSA infections.   Culture, blood (routine x 2)     Status: None (Preliminary result)   Collection Time: 12/04/13 11:20 AM  Result Value Ref Range Status   Specimen Description BLOOD LEFT HAND  Final   Special Requests BOTTLES DRAWN AEROBIC ONLY 3CC  Final   Culture  Setup Time   Final    12/04/2013 17:05 Performed at [facility]    Culture   Final           BLOOD CULTURE RECEIVED NO GROWTH TO DATE CULTURE WILL BE HELD FOR 5 DAYS BEFORE ISSUING A FINAL NEGATIVE REPORT  Performed at [facility]    Report Status PENDING  Incomplete  Culture, blood (routine x 2)     Status: None (Preliminary result)   Collection Time: 12/04/13 11:30 AM  Result Value Ref Range Status   Specimen Description BLOOD LEFT FOREARM  Final   Special Requests BOTTLES DRAWN AEROBIC ONLY 1.5CC  Final   Culture  Setup Time   Final    12/04/2013 17:05 Performed at [facility]    Culture   Final           BLOOD CULTURE RECEIVED NO GROWTH TO DATE CULTURE WILL BE HELD FOR 5 DAYS BEFORE ISSUING A FINAL NEGATIVE REPORT Performed at [facility]    Report Status PENDING  Incomplete     Studies:  Recent x-ray studies have been reviewed in detail by the  Attending Physician  Scheduled Meds:  Scheduled Meds: . aspirin EC  325 mg Oral Daily  . budesonide-formoterol  2 puff Inhalation BID  . folic acid  1 mg Oral Daily  . heparin  5,000 Units Subcutaneous 3 times per day  . ipratropium  0.5 mg Nebulization Q6H  . levalbuterol  0.63 mg Nebulization Q6H  . multivitamin with minerals  1 tablet Oral Daily  . piperacillin-tazobactam (ZOSYN)  IV  3.375 g Intravenous Q8H  . sodium chloride  3 mL Intravenous Q12H  . thiamine  100 mg Oral Daily  . vancomycin  1,500 mg Intravenous Q24H    Time spent on care of this patient: 40 mins   Drema DallasWOODS, Leeam Cedrone, J , MD   [facility] Office  [phone] Pager [phone]  On-Call/Text Page:      [email or web address] [email or web address]      password [email or web address]  If 7PM-7AM, please contact night-coverage [email or web address] Password [email or web address] 12/05/2013, 12:43 PM   LOS: 2 days
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
## 2013-12-05 NOTE — Progress Notes (Signed)
Subjective: No abd pain, no n/v, ate a [place] sandwich last night with no issues.  She wants more  Objective: Vital signs in last 24 hours: Temp:  [97.7 F (36.5 C)-98.6 F (37 C)] 98.2 F (36.8 C) (12/03 0333) Pulse Rate:  [76-132] 80 (12/03 0700) Resp:  [15-30] 17 (12/03 0700) BP: (94-139)/(64-78) 114/65 mmHg (12/03 0700) SpO2:  [88 %-100 %] 92 % (12/03 0700) FiO2 (%):  [45 %] 45 % (12/02 1454) Last BM Date: 12/03/13  Intake/Output from previous day: 12/02 0701 - 12/03 0700 In: 900 [P.O.:200; I.V.:50; IV Piggyback:650] Out: 2025 [Urine:3225] Intake/Output this shift:    GI: soft nt/nd  Lab Results:   Recent Labs  12/04/13 0745 12/04/13 0940  WBC 8.3 7.1  HGB 16.1* 15.9*  HCT 51.0* 51.1*  PLT 112* 112*   BMET  Recent Labs  12/04/13 0745 12/04/13 0940  NA 144 142  K 4.8 4.0  CL 103 100  CO2 28 31  GLUCOSE 79 100*  BUN 40* 38*  CREATININE 1.31* 1.21*  CALCIUM 8.6 8.8   PT/INR No results for input(s): LABPROT, INR in the last 72 hours. ABG  Recent Labs  12/04/13 0922  PHART 7.268*  HCO3 32.6*    Studies/Results: Nm Hepatobiliary Including Gb  12/04/2013   CLINICAL DATA:  Right upper quadrant abdominal pain. Transferred from an outside hospital with suspected acute cholecystitis.  EXAM: NUCLEAR MEDICINE HEPATOBILIARY IMAGING  TECHNIQUE: Sequential images of the abdomen were obtained out to 60 minutes following intravenous administration of radiopharmaceutical.  RADIOPHARMACEUTICALS:  5.0 Mill[place]ie [place]-70W Choletec  COMPARISON:  No correlating imaging studies.  FINDINGS: There is fairly symmetric uptake in the liver. The biliary tree is visualized by approximately 20 min. The gallbladder is visualized at 25 min. The gallbladder appears dilated. Small bowel activity was never demonstrated. Findings suspicious for common bile duct obstruction without cystic duct obstruction.  IMPRESSION: Distended gallbladder without visualization of the small bowel.  Findings suspicious for common bile duct obstruction without cystic duct obstruction.   Electronically Signed   By: Kalman Jewels M.D.   On: 12/04/2013 18:04   Portable Chest 1 View  12/04/2013   CLINICAL DATA:  CHF.  EXAM: PORTABLE CHEST - 1 VIEW  COMPARISON:  None.  FINDINGS: Cardiomegaly pulmonary vascular congestion and bilateral pulmonary infiltrates noted consistent congestive heart failure. Tiny pleural effusions cannot be excluded. No pneumothorax. No acute osseus abnormality  IMPRESSION: Congestive heart failure with pulmonary edema.   Electronically Signed   By: Marcello Moores  Register   On: 12/04/2013 07:02    Anti-infectives: Anti-infectives    Start     Dose/Rate Route Frequency Ordered Stop   12/05/13 1000  vancomycin (VANCOCIN) 1,500 mg in sodium chloride 0.9 % 500 mL IVPB     1,500 mg250 mL/hr over 120 Minutes Intravenous Every 24 hours 12/04/13 0940     12/04/13 0945  vancomycin (VANCOCIN) 2,500 mg in sodium chloride 0.9 % 500 mL IVPB     2,500 mg250 mL/hr over 120 Minutes Intravenous  Once 12/04/13 0938 12/04/13 1238   12/04/13 0200  piperacillin-tazobactam (ZOSYN) IVPB 3.375 g     3.375 g12.5 mL/hr over 240 Minutes Intravenous Every 8 hours 12/04/13 0104        Assessment/Plan: Likely symptomatic cholelithiasis  Her hida scan showed delayed gb filling but she does not have acute cholecystitis as suggested by outside [place].  She does not have clinically either.  Her hida didn't show sb filling but I don't know how long  the scan went.  Her tb and alk phos are normal so I dont think obstructed. I do think she had symptomatic cholelithiasis and this was her first episode she says.  Lap chole at some point is indicated but I think she needs periop risk stratification and cards evaluation prior to any consideration due to possible nstemi.  Will follow along. It is fine if she eats.  [facility] 12/05/2013

## 2013-12-05 NOTE — Plan of Care (Signed)
Problem: Phase I Progression Outcomes Goal: Pain controlled with appropriate interventions Outcome: Completed/Met Date Met:  12/05/13 Goal: OOB as tolerated unless otherwise ordered Outcome: Completed/Met Date Met:  12/05/13 Goal: Initial discharge plan identified Outcome: Completed/Met Date Met:  12/05/13 Goal: Voiding-avoid urinary catheter unless indicated Outcome: Completed/Met Date Met:  12/05/13 Goal: Hemodynamically stable Outcome: Completed/Met Date Met:  12/05/13 Goal: Other Phase I Outcomes/Goals Outcome: Not Applicable Date Met:  21/58/72

## 2013-12-06 DIAGNOSIS — R7989 Other specified abnormal findings of blood chemistry: Secondary | ICD-10-CM

## 2013-12-06 DIAGNOSIS — I420 Dilated cardiomyopathy: Secondary | ICD-10-CM

## 2013-12-06 DIAGNOSIS — A0472 Enterocolitis due to Clostridium difficile, not specified as recurrent: Secondary | ICD-10-CM | POA: Diagnosis present

## 2013-12-06 DIAGNOSIS — N179 Acute kidney failure, unspecified: Secondary | ICD-10-CM

## 2013-12-06 DIAGNOSIS — A047 Enterocolitis due to Clostridium difficile: Principal | ICD-10-CM

## 2013-12-06 DIAGNOSIS — K8050 Calculus of bile duct without cholangitis or cholecystitis without obstruction: Secondary | ICD-10-CM

## 2013-12-06 DIAGNOSIS — J441 Chronic obstructive pulmonary disease with (acute) exacerbation: Secondary | ICD-10-CM

## 2013-12-06 LAB — BLOOD GAS, ARTERIAL
Acid-Base Excess: 6.2 mmol/L — ABNORMAL HIGH (ref 0.0–2.0)
Bicarbonate: 32.8 mEq/L — ABNORMAL HIGH (ref 20.0–24.0)
DRAWN BY: 24610
O2 CONTENT: 4 L/min
O2 SAT: 94.5 %
PATIENT TEMPERATURE: 98.6
TCO2: 35 mmol/L (ref 0–100)
pCO2 arterial: 72.7 mmHg (ref 35.0–45.0)
pH, Arterial: 7.276 — ABNORMAL LOW (ref 7.350–7.450)
pO2, Arterial: 78.9 mmHg — ABNORMAL LOW (ref 80.0–100.0)

## 2013-12-06 LAB — COMPREHENSIVE METABOLIC PANEL
ALBUMIN: 2.5 g/dL — AB (ref 3.5–5.2)
ALK PHOS: 73 U/L (ref 39–117)
ALT: 34 U/L (ref 0–35)
ANION GAP: 12 (ref 5–15)
AST: 25 U/L (ref 0–37)
BUN: 26 mg/dL — AB (ref 6–23)
CO2: 28 mEq/L (ref 19–32)
Calcium: 8.8 mg/dL (ref 8.4–10.5)
Chloride: 100 mEq/L (ref 96–112)
Creatinine, Ser: 1.12 mg/dL — ABNORMAL HIGH (ref 0.50–1.10)
GFR calc Af Amer: 56 mL/min — ABNORMAL LOW (ref >90)
GFR calc non Af Amer: 49 mL/min — ABNORMAL LOW (ref >90)
Glucose, Bld: 93 mg/dL (ref 70–99)
Potassium: 4 mEq/L (ref 3.7–5.3)
Sodium: 140 mEq/L (ref 137–147)
Total Bilirubin: 0.8 mg/dL (ref 0.3–1.2)
Total Protein: 5.8 g/dL — ABNORMAL LOW (ref 6.0–8.3)

## 2013-12-06 LAB — CBC WITH DIFFERENTIAL/PLATELET
BASOS PCT: 0 % (ref 0–1)
Basophils Absolute: 0 10*3/uL (ref 0.0–0.1)
Eosinophils Absolute: 0.3 10*3/uL (ref 0.0–0.7)
Eosinophils Relative: 5 % (ref 0–5)
HEMATOCRIT: 47.6 % — AB (ref 36.0–46.0)
HEMOGLOBIN: 14.7 g/dL (ref 12.0–15.0)
Lymphocytes Relative: 23 % (ref 12–46)
Lymphs Abs: 1.6 10*3/uL (ref 0.7–4.0)
MCH: 28.3 pg (ref 26.0–34.0)
MCHC: 30.9 g/dL (ref 30.0–36.0)
MCV: 91.5 fL (ref 78.0–100.0)
MONOS PCT: 11 % (ref 3–12)
Monocytes Absolute: 0.7 10*3/uL (ref 0.1–1.0)
NEUTROS ABS: 4.2 10*3/uL (ref 1.7–7.7)
Neutrophils Relative %: 61 % (ref 43–77)
Platelets: 99 10*3/uL — ABNORMAL LOW (ref 150–400)
RBC: 5.2 MIL/uL — ABNORMAL HIGH (ref 3.87–5.11)
RDW: 17.6 % — ABNORMAL HIGH (ref 11.5–15.5)
WBC: 6.8 10*3/uL (ref 4.0–10.5)

## 2013-12-06 LAB — GLUCOSE, CAPILLARY
GLUCOSE-CAPILLARY: 105 mg/dL — AB (ref 70–99)
Glucose-Capillary: 99 mg/dL (ref 70–99)
Glucose-Capillary: 77 mg/dL (ref 70–99)

## 2013-12-06 LAB — CLOSTRIDIUM DIFFICILE BY PCR: Toxigenic C. Difficile by PCR: POSITIVE — AB

## 2013-12-06 LAB — PROCALCITONIN: Procalcitonin: 0.1 ng/mL

## 2013-12-06 LAB — MAGNESIUM: Magnesium: 1.7 mg/dL (ref 1.5–2.5)

## 2013-12-06 MED ORDER — VANCOMYCIN 50 MG/ML ORAL SOLUTION
125.0000 mg | Freq: Four times a day (QID) | ORAL | Status: DC
  Administered 2013-12-06 – 2013-12-09 (×13): 125 mg via ORAL
  Filled 2013-12-06 (×15): qty 2.5

## 2013-12-06 NOTE — Progress Notes (Signed)
CRITICAL VALUE ALERT  Critical value received: ph 7.27 pC02 72.9 p02 78.9, bicarb 32.8 Date of notification:  12/06/2013  Time of notification:  911  Critical value read back:Yes.    Nurse who received alert:  Liz BeachP Avangelina Flight RN  MD notified (1st page):  Junious SilkAllison Ellis aware of results, is ordering BiPap  Time of first page:  930  MD notified (2nd page):  Time of second page:  Responding MD:  Junious SilkAllison Ellis aware of results, is ordering BiPap  Time MD responded:  [phone]

## 2013-12-06 NOTE — Progress Notes (Addendum)
TELEMETRY: Reviewed telemetry pt in NSR with rare PVC: Filed Vitals:   12/06/13 0211 12/06/13 0314 12/06/13 0800 12/06/13 0925  BP:   123/58   Pulse:    72  Temp:  97.7 F (36.5 C) 97.5 F (36.4 C)   TempSrc:  Oral Oral   Resp:   18 16  Height:      Weight:      SpO2: 93%  95% 100%    Intake/Output Summary (Last 24 hours) at 12/06/13 0937 Last data filed at 12/06/13 0100  Gross per 24 hour  Intake   1901 ml  Output    550 ml  Net   1351 ml   Filed Weights   12/03/13 2300  Weight: 254 lb 6.6 oz (115.4 kg)    Subjective Patient somnolent on CPAP. Denies any pain.  Marland Kitchen. aspirin EC  325 mg Oral Daily  . budesonide-formoterol  2 puff Inhalation BID  . folic acid  1 mg Oral Daily  . heparin  5,000 Units Subcutaneous 3 times per day  . ipratropium  0.5 mg Nebulization Q6H  . levalbuterol  0.63 mg Nebulization Q6H  . multivitamin with minerals  1 tablet Oral Daily  . regadenoson  0.4 mg Intravenous Once  . sodium chloride  3 mL Intravenous Q12H  . thiamine  100 mg Oral Daily   . sodium chloride Stopped (12/05/13 0935)    LABS: Basic Metabolic Panel:  Recent Labs  16/10/9610/03/15 1550 12/06/13 0324  NA 141 140  K 5.6* 4.0  CL 100 100  CO2 29 28  GLUCOSE 118* 93  BUN 27* 26*  CREATININE 1.04 1.12*  CALCIUM 8.9 8.8  MG 1.9 1.7   Liver Function Tests:  Recent Labs  12/05/13 1550 12/06/13 0324  AST 41* 25  ALT 44* 34  ALKPHOS 86 73  BILITOT 0.9 0.8  PROT 6.3 5.8*  ALBUMIN 2.8* 2.5*    Recent Labs  12/04/13 0940  LIPASE 35   CBC:  Recent Labs  12/05/13 1550 12/06/13 0324  WBC 7.3 6.8  NEUTROABS 4.6 4.2  HGB 16.0* 14.7  HCT 50.1* 47.6*  MCV 90.4 91.5  PLT 113* 99*   Cardiac Enzymes:  Recent Labs  12/04/13 0250 12/04/13 0745 12/04/13 1248  TROPONINI 0.32* 0.31* <0.30   BNP:  Recent Labs  12/04/13 0940  PROBNP 2719.0*   D-Dimer: No results for input(s): DDIMER in the last 72 hours. Hemoglobin A1C:  Recent Labs  12/04/13 0250    HGBA1C 6.0*   Fasting Lipid Panel: No results for input(s): CHOL, HDL, LDLCALC, TRIG, CHOLHDL, LDLDIRECT in the last 72 hours. Thyroid Function Tests:  Recent Labs  12/04/13 0250  TSH 1.750     Radiology/Studies:  Nm Hepatobiliary Including Gb  12/04/2013   CLINICAL DATA:  Right upper quadrant abdominal pain. Transferred from an outside hospital with suspected acute cholecystitis.  EXAM: NUCLEAR MEDICINE HEPATOBILIARY IMAGING  TECHNIQUE: Sequential images of the abdomen were obtained out to 60 minutes following intravenous administration of radiopharmaceutical.  RADIOPHARMACEUTICALS:  5.0 Millicurie Tc-4118m Choletec  COMPARISON:  No correlating imaging studies.  FINDINGS: There is fairly symmetric uptake in the liver. The biliary tree is visualized by approximately 20 min. The gallbladder is visualized at 25 min. The gallbladder appears dilated. Small bowel activity was never demonstrated. Findings suspicious for common bile duct obstruction without cystic duct obstruction.  IMPRESSION: Distended gallbladder without visualization of the small bowel. Findings suspicious for common bile duct obstruction without cystic duct obstruction.  Electronically Signed   By: Loralie ChampagneMark  Gallerani M.D.   On: 12/04/2013 18:04   Ecg: 12/05/13- NSR with anterior T wave abnormality.   Echo: Study Conclusions  - Left ventricle: The cavity size was normal. Wall thickness was increased in a pattern of mild LVH. The estimated ejection fraction was 55%. - Mitral valve: There was mild regurgitation. - Right ventricle: The cavity size was severely dilated. Systolic function was severely reduced. - Right atrium: The atrium was moderately dilated. - Pulmonary arteries: PA peak pressure: 72 mm Hg (S). - Pericardium, extracardiac: There is a small/moderate circumferential pericardial effusion.  PHYSICAL EXAM General: morbidly obese, in no acute distress. Head: Normocephalic, atraumatic, sclera non-icteric,  oropharynx is clear Neck: Negative for carotid bruits. JVD not elevated. No adenopathy Lungs: Clear bilaterally to auscultation without wheezes, rales, or rhonchi. Breathing is unlabored. Heart: RRR S1 S2 without murmurs, rubs, or gallops.  Abdomen: Soft, non-tender, obese, non-distended with normoactive bowel sounds. No hepatomegaly. No rebound/guarding. No obvious abdominal masses. Msk:  Strength and tone appears normal for age. Extremities: No clubbing, cyanosis or edema.  Distal pedal pulses are 2+ and equal bilaterally. Neuro: Alert and oriented X 3. Moves all extremities spontaneously. Psych:  Responds to questions appropriately with a normal affect.  ASSESSMENT AND PLAN: 1. Elevated troponin. Doubt acute ischemic event. In setting of ARF. No symptoms. Normal LV function by Echo. Cath in April 2015 showed minor nonobstructive disease. Ecg changes are nonspecific. [facility] to clear her for surgery but surgery is not anticipated this hospital stay. I think a myoview study can wait and be done as an outpatient. No further cardiac issues at present.  2. Acute biliary colic. 3. Chronic pulmonary HTN probably related to underlying pulmonary disease with OSA/hypoventilation and COPD.   Present on Admission:  . Biliary colic . Acute and chronic respiratory failure with hypoxia . COPD (chronic obstructive pulmonary disease) . (Resolved) Coronary atherosclerosis of native coronary artery . Demand ischemia . Pulmonary edema . Acute renal failure . Pulmonary HTN . Pericardial effusion  Signed, Peter [place]az[place]Jordan, MDFACC 12/06/2013 9:37 AM

## 2013-12-06 NOTE — Progress Notes (Signed)
12-4 [id number]  Gave pt copy of medicare important message.

## 2013-12-06 NOTE — Progress Notes (Signed)
Patient ID: Angela Baldwin, female   DOB: 03-23-1945, 68 y.o.   MRN: [id number]    Subjective: Pt without complaints.  She is tolerating a solid diet with no further pain.    Objective: Vital signs in last 24 hours: Temp:  [97.1 F (36.2 C)-99.4 F (37.4 C)] 97.7 F (36.5 C) (12/04 0314) Pulse Rate:  [78-88] 82 (12/03 1941) Resp:  [11-28] 21 (12/03 2317) BP: (112-120)/(71-72) 120/72 mmHg (12/03 2317) SpO2:  [91 %-100 %] 93 % (12/04 0211) Last BM Date: 12/05/13  Intake/Output from previous day: 12/03 0701 - 12/04 0700 In: 2526.8 [P.O.:1926; I.V.:25.8; IV Piggyback:575] Out: 650 [Urine:650] Intake/Output this shift:    PE: Abd: soft, NT, ND, +BS  Lab Results:   Recent Labs  12/05/13 1550 12/06/13 0324  WBC 7.3 6.8  HGB 16.0* 14.7  HCT 50.1* 47.6*  PLT 113* 99*   BMET  Recent Labs  12/05/13 1550 12/06/13 0324  NA 141 140  K 5.6* 4.0  CL 100 100  CO2 29 28  GLUCOSE 118* 93  BUN 27* 26*  CREATININE 1.04 1.12*  CALCIUM 8.9 8.8   PT/INR No results for input(s): LABPROT, INR in the last 72 hours. CMP     Component Value Date/Time   NA 140 12/06/2013 0324   K 4.0 12/06/2013 0324   CL 100 12/06/2013 0324   CO2 28 12/06/2013 0324   GLUCOSE 93 12/06/2013 0324   BUN 26* 12/06/2013 0324   CREATININE 1.12* 12/06/2013 0324   CALCIUM 8.8 12/06/2013 0324   PROT 5.8* 12/06/2013 0324   ALBUMIN 2.5* 12/06/2013 0324   AST 25 12/06/2013 0324   ALT 34 12/06/2013 0324   ALKPHOS 73 12/06/2013 0324   BILITOT 0.8 12/06/2013 0324   GFRNONAA 49* 12/06/2013 0324   GFRAA 56* 12/06/2013 0324   Lipase     Component Value Date/Time   LIPASE 35 12/04/2013 0940       Studies/Results: Nm Hepatobiliary Including Gb  12/04/2013   CLINICAL DATA:  Right upper quadrant abdominal pain. Transferred from an outside hospital with suspected acute cholecystitis.  EXAM: NUCLEAR MEDICINE HEPATOBILIARY IMAGING  TECHNIQUE: Sequential images of the abdomen were obtained out to 60  minutes following intravenous administration of radiopharmaceutical.  RADIOPHARMACEUTICALS:  5.0 Millicurie Tc-8733m Choletec  COMPARISON:  No correlating imaging studies.  FINDINGS: There is fairly symmetric uptake in the liver. The biliary tree is visualized by approximately 20 min. The gallbladder is visualized at 25 min. The gallbladder appears dilated. Small bowel activity was never demonstrated. Findings suspicious for common bile duct obstruction without cystic duct obstruction.  IMPRESSION: Distended gallbladder without visualization of the small bowel. Findings suspicious for common bile duct obstruction without cystic duct obstruction.   Electronically Signed   By: Loralie ChampagneMark  Gallerani M.D.   On: 12/04/2013 18:04    Anti-infectives: Anti-infectives    Start     Dose/Rate Route Frequency Ordered Stop   12/05/13 1000  vancomycin (VANCOCIN) 1,500 mg in sodium chloride 0.9 % 500 mL IVPB  Status:  Discontinued     1,500 mg250 mL/hr over 120 Minutes Intravenous Every 24 hours 12/04/13 0940 12/05/13 1930   12/04/13 0945  vancomycin (VANCOCIN) 2,500 mg in sodium chloride 0.9 % 500 mL IVPB     2,500 mg250 mL/hr over 120 Minutes Intravenous  Once 12/04/13 0938 12/04/13 1238   12/04/13 0200  piperacillin-tazobactam (ZOSYN) IVPB 3.375 g  Status:  Discontinued     3.375 g12.5 mL/hr over 240 Minutes Intravenous Every 8  hours 12/04/13 0104 12/05/13 1930       Assessment/Plan  1. Biliary colic 2. Acute on chronic respiratory failure 3. ? NSTEMI   Plan: 1. The patient does NOT have cholecystitis.  She had an episode of biliary colic.  Given the possibility of an NSTEMI and she is now asymptomatic, she does not need an urgent cholecystectomy.  She is surgically stable for dc home with follow up with a surgeon in [place], [place] to discuss elective cholecystectomy.  Eugenie BirksLexiscan can be done as inpatient or outpatient, per primary service's preference.  We will sign off.     LOS: 3 days    [name]  E 12/06/2013, 8:44 AM Pager: [phone]

## 2013-12-06 NOTE — Progress Notes (Signed)
Pt placed back on nasal cannula per pt request.  Is alert and oriented x4, following commands.

## 2013-12-06 NOTE — Progress Notes (Signed)
[facility]  Angela Baldwin WUJ:[id number] DOB: 1945/06/25 DOA: 12/03/2013 PCP: Frances Furbish, MD   Brief narrative: 68 year old BF transferred to [facility] from [facility] in [place]. PMHx COPD with chronic hypoxemia baseline oxygen 5 L/m which increases to 10 L/m when ambulatory. She presented to the outside hospital with complaints of abdominal pain primarily in the right upper quadrant with nausea. Duration least for 1 week. Apparent recent treatment for some sort of colitis that may have been C. difficile. CT scan at that outside facility demonstrated a contracted gallbladder, ultrasound completed at [facility]. facility revealed nonmobile stone in the neck of the gallbladder with mild wall thickening and pericholecystic fluid. She did not have fever or leukocytosis. She did have mild elevation in troponin which was concerning for possible demand ischemia. She also had azotemia with elevated creatinine with baseline renal function unknown; possibility of acute renal failure.  Upon arrival to [facility] she continued to have abdominal discomfort on exam. She was hemodynamically stable. O2 sats ration's were between 89 and 90% on 5 L oxygen. A repeat CBC was done which revealed a hemoglobin of 16 but a platelet count 108,000 which was lower than presenting platelet count at previous facility.  Since admission patient has had issues with recurrent symptomatic hypercarbic respiratory failure requiring BiPAP. These episodes appear to be a occuring primarily in the morning which may be an indicator that patient needs to be on either CPAP or BiPAP at hour of sleep. She has also been evaluated by surgery and has undergone a HIDA scan which was not indicative of any biliary tree obstruction or acute cholecystitis. It was felt that patient's GI symptoms if at all related to her gallbladder was related to isolated biliary colic from the large  gallstone seen in the neck of the gallbladder.  As noted patient had mild elevations in troponin were felt to be related to demand ischemia in the setting of chronic kidney disease. She had nonspecific ST segment changes on her EKG. Cardiology has been following and is recommending a Myoview stress test.  Patient was reported with diarrhea prior to admission and apparently had been recently treated for C. difficile as noted above. C. difficile PCR was checked during this admission and has returned positive. Unclear as to whether she has received treatment for C differential previously so as precaution we are starting her on oral vancomycin. It is likely her GI symptoms are explained completely by the C. difficile colitis.  HPI/Subjective: Very lethargic this morning and difficult to awaken. Found patient with nasal cannula oxygen off with oxygen saturation 83% Oxygen reapplied and nurse made aware.  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)   D) Suspected sleep apnea -24 hours after admission patient had chest x-ray findings concerning for pulmonary edema and responded well to a one-time dose of Lasix -2-D echocardiogram revealed normal LVEF with severe right ventricular dilatation and severely reduced right systolic function as well as significant pulmonary artery hypertension of 72 mmHg -Subsequently with marginal diuresis and given need for elevated preload in setting of RV dilatation will discontinue Lasix for now-can certainly give Lasix on a when necessary basis while acutely hospitalized -On 12/4 patient once again lethargic and ABG consistent with respiratory acidosis, Suspect ongoing  hypercarbia related to untreated sleep apnea-have reapplied BiPAP-likely  needs a minimum of CPAP at discharge-if has not had in past, needs a formal polysomnogram study at some point -Continue Xopenex nebulizer and Symbicort MDI -LVEF was normal on cardiac catheterization April 2015 -No clinical signs of pulmonary infection so antibiotics discontinued on 12/3 noting Procalcitonin 0.22 and urinary Legionella and strep were negative -ProBNP only mildly elevated at 2719   C. difficile colitis -Patient with reported diarrhea prior to transport to this facility and apparent prior treatment for C. difficile colitis but unclear exactly when this was and if this was her first episode of treatment -Given these findings will begin oral vancomycin    ? Acute calculous cholecystitis -Appreciate surgical assistance -HIDA scan without evidence of acute cholecystitis or biliary tree obstruction -Appears to be a poor operative candidate therefore if HIDA positive for obstructive cholecystitis likely would need to pursue percutaneous cholecystostomy tube in interventional radiology -LFTs have improved since admission -Lipase normal and no evidence of pancreatitis -Suspect GI symptoms primarily related to current C. difficile colitis and possibly from biliary colic given patient has large gallstone within the gallbladder   Thrombocytopenia -New since presenting to this facility -Suspect related to C. difficile colitis infection    Demand ischemia -minimal elevation in troponin which has now decreased to normal -EKG nonischemic -Cardiac catheterization April 2015 with nonobstructive CAD -Plan is to pursue Myoview Lexiscan this admission    Pericardial effusion -Documented on CT scan and reported as increasing in size as compared to previous scan on 10/28/13 -Echocardiogram this admission with small to moderate circumferential pericardial effusion    Acute renal failure -Baseline renal function unknown -Labs from outside hospital with BUN 50 creatinine 1.6 -BUN and  creatinine have trended downward after hydration noting today 38 and 1.21 -Unfortunately had to stop IV fluids in setting of presumed acute pulmonary edema -worsening of creatinine after diuresis as well as elevation of BUN so have stopped diuretics for now -DC Indocin-would likely not resume upon discharge   DVT prophylaxis: Subcutaneous heparin Code Status: Full Family Communication: No family at bedside Disposition Plan/Expected LOS: Remain in stepdown   Consultants: Gen. Surgery/Dr. Dwain Sarna Cardiology/ Dr Peter [place]   Procedures: 12/2 Echocardiogrm;Left ventricle: mild LVH. LVEF=55%. - Right ventricle: severely dilated. Systolic function was severely reduced. - Right atrium: moderately dilated. - Pulmonary arteries: PA peak pressure: 72 mm Hg (S). - Pericardium, extracardiac: There is a small/moderate circumferential pericardial effusion. 12/2 HIDA Scan:Distended gallbladder; Suspicious for common bile duct obstruction without cystic duct obstruction.    Cultures: Blood cultures 2 pending Urine culture no growth 12/3 C. Diff by PCR Positive   Antibiotics: Zosyn 12/2>> stopped 12/3 Vancomycin 12/3>>.stopped 12/3 Oral vancomycin 12/4 .   Objective: Blood pressure 123/58, pulse 72, temperature 97.5 F (36.4 C), temperature source Oral, resp. rate 16, height 5\' 2"  (1.575 m), weight 254 lb 6.6 oz (115.4 kg), SpO2 100 %.  Intake/Output Summary (Last 24 hours) at 12/06/13 1105 Last data filed at 12/06/13 0100  Gross per 24 hour  Intake   1901 ml  Output    550 ml  Net   1351 ml     Exam: Gen: Very lethargic, increased work of breathing Chest: Faint bibasilar crackles without wheezing, good airway movement, 4 L L nasal cannula was not on patient noting O2 sat duration 83%-transition back to BiPAP once ABG resulted Cardiac: Regular rate and rhythm, S1-S2, no rubs murmurs or gallops, 2+ bilateral lower extremity peripheral edema, no JVD  Abdomen: Soft nontender  nondistended without obvious hepatosplenomegaly, no ascites Extremities: Symmetrical in appearance without cyanosis, clubbing or effusion  Scheduled Meds:  Scheduled Meds: . aspirin EC  325 mg Oral Daily  . budesonide-formoterol  2 puff Inhalation BID  . folic acid  1 mg Oral Daily  . heparin  5,000 Units Subcutaneous 3 times per day  . ipratropium  0.5 mg Nebulization Q6H  . levalbuterol  0.63 mg Nebulization Q6H  . multivitamin with minerals  1 tablet Oral Daily  . regadenoson  0.4 mg Intravenous Once  . sodium chloride  3 mL Intravenous Q12H  . thiamine  100 mg Oral Daily  . vancomycin  125 mg Oral QID   Continuous Infusions: . sodium chloride Stopped (12/05/13 0935)    Data Reviewed: Basic Metabolic Panel:  Recent Labs Lab 12/04/13 0250 12/04/13 0745 12/04/13 0940 12/05/13 1550 12/06/13 0324  NA  --  144 142 141 140  K  --  4.8 4.0 5.6* 4.0  CL  --  103 100 100 100  CO2  --  28 31 29 28   GLUCOSE  --  79 100* 118* 93  BUN  --  40* 38* 27* 26*  CREATININE 1.36* 1.31* 1.21* 1.04 1.12*  CALCIUM  --  8.6 8.8 8.9 8.8  MG  --   --   --  1.9 1.7   Liver Function Tests:  Recent Labs Lab 12/04/13 0745 12/04/13 0940 12/05/13 1550 12/06/13 0324  AST 61* 64* 41* 25  ALT 54* 57* 44* 34  ALKPHOS 92 95 86 73  BILITOT 1.0 1.0 0.9 0.8  PROT 6.0 6.3 6.3 5.8*  ALBUMIN 2.7* 2.8* 2.8* 2.5*    Recent Labs Lab 12/04/13 0940  LIPASE 35   No results for input(s): AMMONIA in the last 168 hours. CBC:  Recent Labs Lab 12/04/13 0250 12/04/13 0745 12/04/13 0940 12/05/13 1550 12/06/13 0324  WBC 8.1 8.3 7.1 7.3 6.8  NEUTROABS  --   --  4.6 4.6 4.2  HGB 16.8* 16.1* 15.9* 16.0* 14.7  HCT 52.1* 51.0* 51.1* 50.1* 47.6*  MCV 91.1 89.8 90.3 90.4 91.5  PLT 108* 112* 112* 113* 99*   Cardiac Enzymes:  Recent Labs Lab 12/04/13 0250 12/04/13 0745 12/04/13 1248  TROPONINI 0.32* 0.31* <0.30   BNP (last 3 results)  Recent Labs  12/04/13 0940  PROBNP 2719.0*    CBG:  Recent Labs Lab 12/05/13 0803 12/06/13 0805  GLUCAP 99 105*    Recent Results (from the past 240 hour(s))  MRSA PCR Screening     Status: None   Collection Time: 12/03/13 11:06 PM  Result Value Ref Range Status   MRSA by PCR NEGATIVE NEGATIVE Final    Comment:        The GeneXpert MRSA Assay (FDA approved for NASAL specimens only), is one component of a comprehensive MRSA colonization surveillance program. It is not intended to diagnose MRSA infection nor to guide or monitor treatment for MRSA infections.   Culture, blood (routine x 2)     Status: None (Preliminary result)   Collection Time: 12/04/13 11:20 AM  Result Value Ref Range Status   Specimen Description BLOOD LEFT HAND  Final   Special Requests BOTTLES DRAWN AEROBIC ONLY 3CC  Final   Culture  Setup Time   Final    12/04/2013 17:05 Performed at [facility]    Culture   Final           BLOOD CULTURE RECEIVED NO GROWTH  TO DATE CULTURE WILL BE HELD FOR 5 DAYS BEFORE ISSUING A FINAL NEGATIVE REPORT Performed at [facility]    Report Status PENDING  Incomplete  Culture, blood (routine x 2)     Status: None (Preliminary result)   Collection Time: 12/04/13 11:30 AM  Result Value Ref Range Status   Specimen Description BLOOD LEFT FOREARM  Final   Special Requests BOTTLES DRAWN AEROBIC ONLY 1.5CC  Final   Culture  Setup Time   Final    12/04/2013 17:05 Performed at [facility]    Culture   Final           BLOOD CULTURE RECEIVED NO GROWTH TO DATE CULTURE WILL BE HELD FOR 5 DAYS BEFORE ISSUING A FINAL NEGATIVE REPORT Performed at [facility]    Report Status PENDING  Incomplete  Urine culture     Status: None   Collection Time: 12/04/13  6:10 PM  Result Value Ref Range Status   Specimen Description URINE, CLEAN CATCH  Final   Special Requests NONE  Final   Culture  Setup Time   Final    12/05/2013 00:16 Performed at [facility]    Colony Count NO  GROWTH Performed at [facility]   Final   Culture NO GROWTH Performed at [facility]   Final   Report Status 12/05/2013 FINAL  Final  Clostridium Difficile by PCR     Status: Abnormal   Collection Time: 12/05/13  9:00 PM  Result Value Ref Range Status   C difficile by pcr POSITIVE (A) NEGATIVE Final    Comment: CRITICAL RESULT CALLED TO, READ BACK BY AND VERIFIED WITH: Raford PitcherP. SPRAMOSKI RN 11:05 12/06/13 ([email or web address])      Studies:  Recent x-ray studies have been reviewed in detail by the Attending Physician  Time spent :      Junious SilkAllison Ellis, ANP [facility] Office  [phone] Pager [phone]        **If unable to reach the above provider after paging please contact the Flow Manager @ [phone]  On-Call/Text Page:      [email or web address]      password [email or web address]  If 7PM-7AM, please contact night-coverage [email or web address] Password [email or web address] 12/06/2013, 11:05 AM   LOS: 3 days   Examined patient and discussed assessment and plan with ANP Revonda StandardAllison, agree with above plan. Patient with multiple complex medical issues> 40 minutes spent in direct patient care

## 2013-12-06 NOTE — Plan of Care (Signed)
Problem: Consults Goal: General Medical Patient Education See Patient Education Module for specific education.  Outcome: Completed/Met Date Met:  12/06/13 Goal: Nutrition Consult-if indicated Outcome: Not Applicable Date Met:  16/42/90  Problem: Phase II Progression Outcomes Goal: Progress activity as tolerated unless otherwise ordered Outcome: Completed/Met Date Met:  12/06/13 Goal: Discharge plan established Outcome: Completed/Met Date Met:  12/06/13 Goal: Vital signs remain stable Outcome: Completed/Met Date Met:  12/06/13 Goal: IV changed to normal saline lock Outcome: Completed/Met Date Met:  12/06/13 Goal: Obtain order to discontinue catheter if appropriate Outcome: Not Applicable Date Met:  37/95/58

## 2013-12-07 ENCOUNTER — Inpatient Hospital Stay (HOSPITAL_COMMUNITY): Payer: Medicare Other

## 2013-12-07 DIAGNOSIS — J810 Acute pulmonary edema: Secondary | ICD-10-CM | POA: Diagnosis present

## 2013-12-07 DIAGNOSIS — J441 Chronic obstructive pulmonary disease with (acute) exacerbation: Secondary | ICD-10-CM | POA: Diagnosis present

## 2013-12-07 DIAGNOSIS — N179 Acute kidney failure, unspecified: Secondary | ICD-10-CM | POA: Diagnosis present

## 2013-12-07 DIAGNOSIS — I420 Dilated cardiomyopathy: Secondary | ICD-10-CM | POA: Diagnosis present

## 2013-12-07 DIAGNOSIS — K8000 Calculus of gallbladder with acute cholecystitis without obstruction: Secondary | ICD-10-CM | POA: Diagnosis present

## 2013-12-07 DIAGNOSIS — M25579 Pain in unspecified ankle and joints of unspecified foot: Secondary | ICD-10-CM

## 2013-12-07 DIAGNOSIS — I272 Pulmonary hypertension, unspecified: Secondary | ICD-10-CM | POA: Diagnosis present

## 2013-12-07 LAB — CBC WITH DIFFERENTIAL/PLATELET
Basophils Absolute: 0 10*3/uL (ref 0.0–0.1)
Basophils Relative: 0 % (ref 0–1)
EOS ABS: 0.5 10*3/uL (ref 0.0–0.7)
Eosinophils Relative: 7 % — ABNORMAL HIGH (ref 0–5)
HEMATOCRIT: 47.7 % — AB (ref 36.0–46.0)
Hemoglobin: 14.8 g/dL (ref 12.0–15.0)
Lymphocytes Relative: 25 % (ref 12–46)
Lymphs Abs: 1.6 10*3/uL (ref 0.7–4.0)
MCH: 28.8 pg (ref 26.0–34.0)
MCHC: 31 g/dL (ref 30.0–36.0)
MCV: 93 fL (ref 78.0–100.0)
MONOS PCT: 9 % (ref 3–12)
Monocytes Absolute: 0.6 10*3/uL (ref 0.1–1.0)
Neutro Abs: 3.7 10*3/uL (ref 1.7–7.7)
Neutrophils Relative %: 58 % (ref 43–77)
Platelets: 118 10*3/uL — ABNORMAL LOW (ref 150–400)
RBC: 5.13 MIL/uL — ABNORMAL HIGH (ref 3.87–5.11)
RDW: 17.7 % — ABNORMAL HIGH (ref 11.5–15.5)
WBC: 6.3 10*3/uL (ref 4.0–10.5)

## 2013-12-07 LAB — SEDIMENTATION RATE: SED RATE: 4 mm/h (ref 0–22)

## 2013-12-07 LAB — COMPREHENSIVE METABOLIC PANEL
ALT: 29 U/L (ref 0–35)
AST: 24 U/L (ref 0–37)
Albumin: 2.3 g/dL — ABNORMAL LOW (ref 3.5–5.2)
Alkaline Phosphatase: 88 U/L (ref 39–117)
Anion gap: 10 (ref 5–15)
BUN: 21 mg/dL (ref 6–23)
CALCIUM: 9 mg/dL (ref 8.4–10.5)
CO2: 32 meq/L (ref 19–32)
CREATININE: 0.91 mg/dL (ref 0.50–1.10)
Chloride: 102 mEq/L (ref 96–112)
GFR calc non Af Amer: 62 mL/min — ABNORMAL LOW (ref >90)
GFR, EST AFRICAN AMERICAN: 72 mL/min — AB (ref >90)
GLUCOSE: 98 mg/dL (ref 70–99)
Potassium: 4.3 mEq/L (ref 3.7–5.3)
SODIUM: 144 meq/L (ref 137–147)
Total Bilirubin: 0.5 mg/dL (ref 0.3–1.2)
Total Protein: 5.6 g/dL — ABNORMAL LOW (ref 6.0–8.3)

## 2013-12-07 LAB — MAGNESIUM: Magnesium: 1.9 mg/dL (ref 1.5–2.5)

## 2013-12-07 LAB — GLUCOSE, CAPILLARY
GLUCOSE-CAPILLARY: 112 mg/dL — AB (ref 70–99)
GLUCOSE-CAPILLARY: 109 mg/dL — AB (ref 70–99)
Glucose-Capillary: 90 mg/dL (ref 70–99)

## 2013-12-07 LAB — C-REACTIVE PROTEIN: CRP: 5.4 mg/dL — ABNORMAL HIGH (ref <0.60)

## 2013-12-07 LAB — RHEUMATOID FACTOR

## 2013-12-07 LAB — URIC ACID: Uric Acid, Serum: 7.8 mg/dL — ABNORMAL HIGH (ref 2.4–7.0)

## 2013-12-07 NOTE — Progress Notes (Signed)
[facility] TEAM 1 - Stepdown/ICU TEAM Progress Note  Angela Baldwin CZY:[id number] DOB: Apr 18, 1945 DOA: 12/03/2013 PCP: Derek Jack, MD  Admit HPI / Brief Narrative: 68 year old BF transferred to [facility] from [facility] in [place]. PMHx COPD with chronic hypoxemia baseline oxygen 5 L/m which increases to 10 L/m when ambulatory. She presented to the outside hospital with complaints of abdominal pain primarily in the right upper quadrant with nausea. Duration least for 1 week. Apparent recent treatment for some sort of colitis that may have been C. difficile. CT scan at that outside facility demonstrated a contracted gallbladder, ultrasound completed at [facility]. facility revealed nonmobile stone in the neck of the gallbladder with mild wall thickening and pericholecystic fluid. She did not have fever or leukocytosis. She did have mild elevation in troponin which was concerning for possible demand ischemia. She also had azotemia with elevated creatinine with baseline renal function unknown; possibility of acute renal failure.  Upon arrival to [facility] she continued to have abdominal discomfort on exam. She was hemodynamically stable. O2 sats ration's were between 89 and 90% on 5 L oxygen. A repeat CBC was done which revealed a hemoglobin of 16 but a platelet count 108,000 which was lower than presenting platelet count at previous facility.  Since admission patient has had issues with recurrent symptomatic hypercarbic respiratory failure requiring BiPAP. These episodes appear to be a occuring primarily in the morning which may be an indicator that patient needs to be on either CPAP or BiPAP at hour of sleep. She has also been evaluated by surgery and has undergone a HIDA scan which was not indicative of any biliary tree obstruction or acute cholecystitis. It was felt that patient's GI symptoms if at all related to her gallbladder was related to isolated biliary colic from the  large gallstone seen in the neck of the gallbladder.  As noted patient had mild elevations in troponin were felt to be related to demand ischemia in the setting of chronic kidney disease. She had nonspecific ST segment changes on her EKG. Cardiology has been following and is recommending a Myoview stress test.  Patient was reported with diarrhea prior to admission and apparently had been recently treated for C. difficile as noted above. C. difficile PCR was checked during this admission and has returned positive. Unclear as to whether she has received treatment for C differential previously so as precaution we are starting her on oral vancomycin. It is likely her GI symptoms are explained completely by the C. difficile colitis.   HPI/Subjective: 12/5 A/O 4, states bilateral ankle pain and swelling. Negative CP, at her baseline SOB,    Assessment/Plan: Acute on chronic respiratory failure with hypoxia/Hypercapnia: -12/4 Subsequently with marginal diuresis and given need for elevated preload in setting of RV dilatation will discontinue Lasix for now-can certainly give Lasix on a when necessary basis while acutely hospitalized -Patient's appears at baseline able to carry on conversation without distress -Patient has had previous polysomnogram (records unavailable) in [place], and per patient was negative for OSA although her description of procedure sounds as if they placed a self titrating CPAP mask. -Recommend repeating polysomnogram in the [facility] system upon discharge.  COPD (chronic obstructive pulmonary disease) -Continue Symbicort -Continue Xopenex QID -Continue ipratropium QID  Pulmonary edema/pleural effusion - See acute on chronic respiratory failure  Cardiomyopathy dilated -Cardiology recommends Lexis scan Myoview study as an outpatient   Demand ischemia -minimal elevation in troponin which has now decreased to normal -EKG nonischemic -  Cardiac catheterization April 2015  with nonobstructive CAD  Pulmonary HTN -LVEF was normal on cardiac catheterization April 2015 -Has documented history of pulmonary hypertension; echocardiogram shows right heart failure, see results below -ProBNP only mildly elevated at 2719 -Urinary strep negative, urinary Legionella antigen negative: stop all antibiotics    Pericardial effusion -Documented on CT scan and reported as increasing in size as compared to previous scan on 10/28/13 -Echocardiogram shows mild-moderate effusion, no recommendations from cardiology for any invasive procedure.  Acute calculous cholecystitis -HIDA scan; suspicious for obstruction see results below. Surgery believes patient needs laparoscopic cholecystectomy.  -Surgery after cardiology evaluation does not need urgent cholecystectomy at this time. -Lipase normal and no evidence of pancreatitis  Thrombocytopenia -New since presenting to this facility -Resolving   Acute renal failure -Baseline renal function unknown -Labs from outside hospital with BUN 50 creatinine 1.6 -BUN and creatinine have trended downward after hydration noting today is 21  and 0.91  Hyperkalemia -Resolved  Diarrhea -No further diarrhea since admission  C. difficile colitis -Patient with reported diarrhea prior to transport to this facility and apparent prior treatment for C. difficile colitis but unclear exactly when this was and if this was her first episode of treatment -Continue PO vancomycin 125 mg QID 14 days  Bilateral ankle pain (OA vs RA vs gout flare)  -Patient with extreme pain to palpation, positive effusion will obtain x-rays. -Obtain uric acid level, CRP, ESR    Code Status: Full Family Communication: No family at bedside Disposition Plan/Expected LOS:   Consultants: Dr. Ena Dawley (Cardiology) Dr. Rolm Bookbinder (CCS)   Procedure/Significant Events: 12/2 Echocardiogrm;Left ventricle:  mild LVH. LVEF=55%. - Right ventricle: severely  dilated. Systolic function was severely reduced. - Right atrium: moderately dilated. - Pulmonary arteries: PA peak pressure: 72 mm Hg (S). - Pericardium, extracardiac: There is a small/moderate circumferential pericardial effusion. 12/2 HIDA Scan:Distended gallbladder; Suspicious for common bile duct obstruction without cystic duct obstruction.    Culture 12/2 blood left hand/forearm NGTD 12/2 urine negative 12/3 C. Diff by PCR Positive  Antibiotics: Zosyn 12/2>> stopped 12/3 Vancomycin 12/3>>.stopped 12/3 Oral vancomycin 12/4 .  DVT prophylaxis: Subcutaneous heparin   Devices   LINES / TUBES:      Continuous Infusions: . sodium chloride Stopped (12/05/13 0935)    Objective: VITAL SIGNS: Temp: 97.2 F (36.2 C) (12/05 1100) Temp Source: Oral (12/05 1100) BP: 135/74 mmHg (12/05 1100) Pulse Rate: 92 (12/05 1100) SPO2; 93% on 3 L O2 via [place] FIO2:   Intake/Output Summary (Last 24 hours) at 12/07/13 1152 Last data filed at 12/07/13 0600  Gross per 24 hour  Intake   1215 ml  Output    650 ml  Net    565 ml     Exam: General: A/O 4, moderate chronic respiratory distress Lungs: Mild diffuse wheezing, with diffuse crackles Cardiovascular: Tachycardic, Regular rhythm without murmur gallop or rub normal S1 and S2 Abdomen: Morbidly obese, Nontender, nondistended, soft, bowel sounds positive, no rebound, no ascites, no appreciable mass Extremities: No significant cyanosis, clubbing. Bilateral ankle effusions, extremely tender to palpation/passive flexation/extension, negative erythema, negative warmth to touch.   Data Reviewed: Basic Metabolic Panel:  Recent Labs Lab 12/04/13 0745 12/04/13 0940 12/05/13 1550 12/06/13 0324 12/07/13 0311  NA 144 142 141 140 144  K 4.8 4.0 5.6* 4.0 4.3  CL 103 100 100 100 102  CO2 _0 32  GLUCOSE 79 100* 118* 93 98  BUN 40* 38* 27* 26* 21  CREATININE  1.31* 1.21* 1.04 1.12* 0.91  CALCIUM 8.6 8.8 8.9 8.8 9.0  MG  --    --  1.9 1.7 1.9   Liver Function Tests:  Recent Labs Lab 12/04/13 0745 12/04/13 0940 12/05/13 1550 12/06/13 0324 12/07/13 0311  AST 61* 64* 41* 25 24  ALT 54* 57* 44* 34 29  ALKPHOS 92 95 86 73 88  BILITOT 1.0 1.0 0.9 0.8 0.5  PROT 6.0 6.3 6.3 5.8* 5.6*  ALBUMIN 2.7* 2.8* 2.8* 2.5* 2.3*    Recent Labs Lab 12/04/13 0940  LIPASE 35   No results for input(s): AMMONIA in the last 168 hours. CBC:  Recent Labs Lab 12/04/13 0745 12/04/13 0940 12/05/13 1550 12/06/13 0324 12/07/13 0311  WBC 8.3 7.1 7.3 6.8 6.3  NEUTROABS  --  4.6 4.6 4.2 3.7  HGB 16.1* 15.9* 16.0* 14.7 14.8  HCT 51.0* 51.1* 50.1* 47.6* 47.7*  MCV 89.8 90.3 90.4 91.5 93.0  PLT 112* 112* 113* 99* 118*   Cardiac Enzymes:  Recent Labs Lab 12/04/13 0250 12/04/13 0745 12/04/13 1248  TROPONINI 0.32* 0.31* <0.30   BNP (last 3 results)  Recent Labs  12/04/13 0940  PROBNP 2719.0*   CBG:  Recent Labs Lab 12/05/13 0803 12/06/13 0805 12/06/13 1135 12/06/13 1711 12/07/13 0755  GLUCAP 99 105* 99 77 112*    Recent Results (from the past 240 hour(s))  MRSA PCR Screening     Status: None   Collection Time: 12/03/13 11:06 PM  Result Value Ref Range Status   MRSA by PCR NEGATIVE NEGATIVE Final    Comment:        The GeneXpert MRSA Assay (FDA approved for NASAL specimens only), is one component of a comprehensive MRSA colonization surveillance program. It is not intended to diagnose MRSA infection nor to guide or monitor treatment for MRSA infections.   Culture, blood (routine x 2)     Status: None (Preliminary result)   Collection Time: 12/04/13 11:20 AM  Result Value Ref Range Status   Specimen Description BLOOD LEFT HAND  Final   Special Requests BOTTLES DRAWN AEROBIC ONLY 3CC  Final   Culture  Setup Time   Final    12/04/2013 17:05 Performed at [facility]    Culture   Final           BLOOD CULTURE RECEIVED NO GROWTH TO DATE CULTURE WILL BE HELD FOR 5 DAYS BEFORE ISSUING  A FINAL NEGATIVE REPORT Performed at [facility]    Report Status PENDING  Incomplete  Culture, blood (routine x 2)     Status: None (Preliminary result)   Collection Time: 12/04/13 11:30 AM  Result Value Ref Range Status   Specimen Description BLOOD LEFT FOREARM  Final   Special Requests BOTTLES DRAWN AEROBIC ONLY 1.5CC  Final   Culture  Setup Time   Final    12/04/2013 17:05 Performed at [facility]    Culture   Final           BLOOD CULTURE RECEIVED NO GROWTH TO DATE CULTURE WILL BE HELD FOR 5 DAYS BEFORE ISSUING A FINAL NEGATIVE REPORT Performed at [facility]    Report Status PENDING  Incomplete  Urine culture     Status: None   Collection Time: 12/04/13  6:10 PM  Result Value Ref Range Status   Specimen Description URINE, CLEAN CATCH  Final   Special Requests NONE  Final   Culture  Setup Time   Final    12/05/2013  00:16 Performed at [place] Performed at [facility]   Final   Culture NO GROWTH Performed at [facility]   Final   Report Status 12/05/2013 FINAL  Final  Clostridium Difficile by PCR     Status: Abnormal   Collection Time: 12/05/13  9:00 PM  Result Value Ref Range Status   C difficile by pcr POSITIVE (A) NEGATIVE Final    Comment: CRITICAL RESULT CALLED TO, READ BACK BY AND VERIFIED WITH: Gertha Calkin RN 11:05 12/06/13 ([email or web address])      Studies:  Recent x-ray studies have been reviewed in detail by the Attending Physician  Scheduled Meds:  Scheduled Meds: . aspirin EC  325 mg Oral Daily  . budesonide-formoterol  2 puff Inhalation BID  . folic acid  1 mg Oral Daily  . heparin  5,000 Units Subcutaneous 3 times per day  . ipratropium  0.5 mg Nebulization Q6H  . levalbuterol  0.63 mg Nebulization Q6H  . multivitamin with minerals  1 tablet Oral Daily  . regadenoson  0.4 mg Intravenous Once  . sodium chloride  3 mL Intravenous Q12H  . thiamine  100 mg Oral Daily  .  vancomycin  125 mg Oral QID    Time spent on care of this patient: 40 mins   Allie Bossier , MD   [facility] Office  [phone] Pager [phone]  On-Call/Text Page:      [email or web address]      password [email or web address]  If 7PM-7AM, please contact night-coverage [email or web address] Password [email or web address] 12/07/2013, 11:52 AM   LOS: 4 days

## 2013-12-08 DIAGNOSIS — R197 Diarrhea, unspecified: Secondary | ICD-10-CM

## 2013-12-08 DIAGNOSIS — N178 Other acute kidney failure: Secondary | ICD-10-CM

## 2013-12-08 LAB — CBC WITH DIFFERENTIAL/PLATELET
BASOS PCT: 0 % (ref 0–1)
Basophils Absolute: 0 10*3/uL (ref 0.0–0.1)
EOS ABS: 0.3 10*3/uL (ref 0.0–0.7)
Eosinophils Relative: 4 % (ref 0–5)
HCT: 47.7 % — ABNORMAL HIGH (ref 36.0–46.0)
HEMOGLOBIN: 14.8 g/dL (ref 12.0–15.0)
Lymphocytes Relative: 26 % (ref 12–46)
Lymphs Abs: 1.7 10*3/uL (ref 0.7–4.0)
MCH: 28.5 pg (ref 26.0–34.0)
MCHC: 31 g/dL (ref 30.0–36.0)
MCV: 91.9 fL (ref 78.0–100.0)
Monocytes Absolute: 0.6 10*3/uL (ref 0.1–1.0)
Monocytes Relative: 9 % (ref 3–12)
NEUTROS PCT: 61 % (ref 43–77)
Neutro Abs: 4.1 10*3/uL (ref 1.7–7.7)
PLATELETS: 130 10*3/uL — AB (ref 150–400)
RBC: 5.19 MIL/uL — ABNORMAL HIGH (ref 3.87–5.11)
RDW: 17.6 % — ABNORMAL HIGH (ref 11.5–15.5)
WBC: 6.7 10*3/uL (ref 4.0–10.5)

## 2013-12-08 LAB — MAGNESIUM: MAGNESIUM: 1.9 mg/dL (ref 1.5–2.5)

## 2013-12-08 LAB — COMPREHENSIVE METABOLIC PANEL
ALK PHOS: 70 U/L (ref 39–117)
ALT: 19 U/L (ref 0–35)
AST: 22 U/L (ref 0–37)
Albumin: 2.3 g/dL — ABNORMAL LOW (ref 3.5–5.2)
Anion gap: 10 (ref 5–15)
BUN: 13 mg/dL (ref 6–23)
CALCIUM: 9 mg/dL (ref 8.4–10.5)
CO2: 29 mEq/L (ref 19–32)
Chloride: 103 mEq/L (ref 96–112)
Creatinine, Ser: 0.76 mg/dL (ref 0.50–1.10)
GFR calc non Af Amer: 83 mL/min — ABNORMAL LOW (ref >90)
Glucose, Bld: 88 mg/dL (ref 70–99)
POTASSIUM: 4.3 meq/L (ref 3.7–5.3)
Sodium: 142 mEq/L (ref 137–147)
Total Bilirubin: 0.7 mg/dL (ref 0.3–1.2)
Total Protein: 5.7 g/dL — ABNORMAL LOW (ref 6.0–8.3)

## 2013-12-08 LAB — GLUCOSE, CAPILLARY: Glucose-Capillary: 100 mg/dL — ABNORMAL HIGH (ref 70–99)

## 2013-12-08 LAB — PROCALCITONIN: Procalcitonin: <0.1 ng/mL

## 2013-12-08 MED ORDER — COLCHICINE 0.6 MG PO TABS
0.6000 mg | ORAL_TABLET | Freq: Every day | ORAL | Status: DC
  Administered 2013-12-08 – 2013-12-09 (×2): 0.6 mg via ORAL
  Filled 2013-12-08 (×4): qty 1

## 2013-12-08 NOTE — Plan of Care (Signed)
Problem: Phase III Progression Outcomes Goal: Pain controlled on oral analgesia Outcome: Completed/Met Date Met:  12/08/13

## 2013-12-08 NOTE — Progress Notes (Signed)
Report called to [facility] on 6E. Pt to be transported promptly.

## 2013-12-08 NOTE — Progress Notes (Signed)
Pt states that she had a oxygen tank labeled "[facility]" with her when she was admitted. There isn't a note found by the note about oxygen tank however pt with vivid memory of oxygen tank being set in the corner of the room. Charge RN on 2H made aware as well as receiving RN on 6E notified.

## 2013-12-08 NOTE — Progress Notes (Signed)
[place] TEAM 1 - Stepdown/ICU TEAM Progress Note  Angela Baldwin TOI:[id number] DOB: [birth year] DOA: 12/03/2013 PCP: Derek Jack, MD  Admit HPI / Brief Narrative: 68 year old BF transferred to [facility] from [facility] in [place]. PMHx COPD with chronic hypoxemia baseline oxygen 5 L/m which increases to 10 L/m when ambulatory. She presented to the outside hospital with complaints of abdominal pain primarily in the right upper quadrant with nausea. Duration least for 1 week. Apparent recent treatment for some sort of colitis that may have been C. difficile. CT scan at that outside facility demonstrated a contracted gallbladder, ultrasound completed at [facility]. facility revealed nonmobile stone in the neck of the gallbladder with mild wall thickening and pericholecystic fluid. She did not have fever or leukocytosis. She did have mild elevation in troponin which was concerning for possible demand ischemia. She also had azotemia with elevated creatinine with baseline renal function unknown; possibility of acute renal failure.  Upon arrival to [facility] she continued to have abdominal discomfort on exam. She was hemodynamically stable. O2 sats ration's were between 89 and 90% on 5 L oxygen. A repeat CBC was done which revealed a hemoglobin of 16 but a platelet count 108,000 which was lower than presenting platelet count at previous facility.  Since admission patient has had issues with recurrent symptomatic hypercarbic respiratory failure requiring BiPAP. These episodes appear to be a occuring primarily in the morning which may be an indicator that patient needs to be on either CPAP or BiPAP at hour of sleep. She has also been evaluated by surgery and has undergone a HIDA scan which was not indicative of any biliary tree obstruction or acute cholecystitis. It was felt that patient's GI symptoms if at all related to her gallbladder was related to isolated biliary colic from the  large gallstone seen in the neck of the gallbladder.  As noted patient had mild elevations in troponin were felt to be related to demand ischemia in the setting of chronic kidney disease. She had nonspecific ST segment changes on her EKG. Cardiology has been following and is recommending a Myoview stress test.  Patient was reported with diarrhea prior to admission and apparently had been recently treated for C. difficile as noted above. C. difficile PCR was checked during this admission and has returned positive. Unclear as to whether she has received treatment for C differential previously so as precaution we are starting her on oral vancomycin. It is likely her GI symptoms are explained completely by the C. difficile colitis.   HPI/Subjective: 12/6 A/O 4, states bilateral ankle pain and swelling (decreased from 12/5). Negative CP, at her baseline SOB,    Assessment/Plan: Acute on chronic respiratory failure with hypoxia/Hypercapnia: -12/4 Subsequently with marginal diuresis and given need for elevated preload in setting of RV dilatation will discontinue Lasix for now-can certainly give Lasix on a when necessary basis while acutely hospitalized -Patient's appears at baseline able to carry on conversation without distress -Patient has had previous polysomnogram (records unavailable) in [place], and per patient was negative for OSA although her description of procedure sounds as if they placed a self titrating CPAP mask. -Recommend repeating polysomnogram in the [facility] system upon discharge.  COPD (chronic obstructive pulmonary disease) -Continue Symbicort -Continue Xopenex QID -Continue ipratropium QID  Pulmonary edema/pleural effusion - See acute on chronic respiratory failure  Cardiomyopathy dilated -Cardiology recommends Lexis scan Myoview study as an outpatient   Demand ischemia -minimal elevation in troponin which has now decreased to  normal -EKG nonischemic -Cardiac  catheterization April 2015 with nonobstructive CAD  Pulmonary HTN -LVEF was normal on cardiac catheterization April 2015 -Has documented history of pulmonary hypertension; echocardiogram shows right heart failure, see results below -ProBNP only mildly elevated at 2719 -Urinary strep negative, urinary Legionella antigen negative: stop all antibiotics    Pericardial effusion -Documented on CT scan and reported as increasing in size as compared to previous scan on 10/28/13 -Echocardiogram shows mild-moderate effusion, no recommendations from cardiology for any invasive procedure.  Acute calculous cholecystitis -HIDA scan; suspicious for obstruction see results below. Surgery believes patient needs laparoscopic cholecystectomy.  -Surgery after cardiology evaluation does not need urgent cholecystectomy at this time. -Lipase normal and no evidence of pancreatitis  Thrombocytopenia -Resolving   Acute renal failure -Baseline renal function unknown -Labs from outside hospital with BUN 50 creatinine 1.6 -Resolved; BUN and creatinine 13/ 0.76  Hyperkalemia -Resolved  Diarrhea -No further diarrhea since admission  C. difficile colitis -Patient with reported diarrhea prior to transport to this facility and apparent prior treatment for C. difficile colitis but unclear exactly when this was and if this was her first episode of treatment -Continue PO vancomycin 125 mg QID 14 days  Bilateral ankle pain secondary to OA/ gout flare  -Ankle X-Ray swelling and arthritis;  -Uric Acid High @ 7.8, CRP High 5.4, ESR NL, RF negative - Start  Colchicine 0.6 mg Daily     Code Status: Full Family Communication: No family at bedside Disposition Plan/Expected LOS:   Consultants: Dr. Ena Dawley (Cardiology) Dr. Rolm Bookbinder (CCS)   Procedure/Significant Events: 12/2 Echocardiogrm;Left ventricle:  mild LVH. LVEF=55%. - Right ventricle: severely dilated. Systolic function was severely  reduced. - Right atrium: moderately dilated. - Pulmonary arteries: PA peak pressure: 72 mm Hg (S). - Pericardium, extracardiac: There is a small/moderate circumferential pericardial effusion. 12/2 HIDA Scan:Distended gallbladder; Suspicious for common bile duct obstruction without cystic duct obstruction.  12/5 Rt/Lt Ankle X-Ray; Diffuse ankle soft tissue swelling-No gross malalignment or fracture; Rt Ankle Arthritis at the talonavicular joint. Minimal tibiotalar arthritis. Small heel spur     Culture 12/2 blood left hand/forearm NGTD 12/2 urine negative 12/3 C. Diff by PCR Positive  Antibiotics: Zosyn 12/2>> stopped 12/3 Vancomycin 12/3>>.stopped 12/3 Oral vancomycin 12/4 .  DVT prophylaxis: Subcutaneous heparin   Devices   LINES / TUBES:      Continuous Infusions: . sodium chloride Stopped (12/05/13 0935)    Objective: VITAL SIGNS: Temp: 98.9 F (37.2 C) (12/06 0806) Temp Source: Oral (12/06 0806) BP: 125/69 mmHg (12/06 0806) Pulse Rate: 73 (12/06 0806) SPO2; 93% on 3 L O2 via [place] FIO2:   Intake/Output Summary (Last 24 hours) at 12/08/13 1353 Last data filed at 12/08/13 1242  Gross per 24 hour  Intake    240 ml  Output   1850 ml  Net  -1610 ml     Exam: General: A/O 4, moderate chronic respiratory distress Lungs: Mild diffuse wheezing, with diffuse crackles Cardiovascular: Rhythm and rate without murmur gallop or rub normal S1 and S2 Abdomen: Morbidly obese, Nontender, nondistended, soft, bowel sounds positive, no rebound, no ascites, no appreciable mass Extremities: No significant cyanosis, clubbing. Bilateral ankle effusions, tender to palpation/passive flexation/extension, negative erythema, negative warmth to touch improved from yesterday.   Data Reviewed: Basic Metabolic Panel:  Recent Labs Lab 12/04/13 0940 12/05/13 1550 12/06/13 0324 12/07/13 0311 12/08/13 0355  NA 142 141 140 144 142  K 4.0 5.6* 4.0 4.3 4.3  CL 100 100 100 102  103   CO2 $Re'31 29 28 'CfM$ 32 29  GLUCOSE 100* 118* 93 98 88  BUN 38* 27* 26* 21 13  CREATININE 1.21* 1.04 1.12* 0.91 0.76  CALCIUM 8.8 8.9 8.8 9.0 9.0  MG  --  1.9 1.7 1.9 1.9   Liver Function Tests:  Recent Labs Lab 12/04/13 0940 12/05/13 1550 12/06/13 0324 12/07/13 0311 12/08/13 0355  AST 64* 41* $Remov'25 24 22  'eNpfPK$ ALT 57* 44* 34 29 19  ALKPHOS 95 86 73 88 70  BILITOT 1.0 0.9 0.8 0.5 0.7  PROT 6.3 6.3 5.8* 5.6* 5.7*  ALBUMIN 2.8* 2.8* 2.5* 2.3* 2.3*    Recent Labs Lab 12/04/13 0940  LIPASE 35   No results for input(s): AMMONIA in the last 168 hours. CBC:  Recent Labs Lab 12/04/13 0940 12/05/13 1550 12/06/13 0324 12/07/13 0311 12/08/13 0355  WBC 7.1 7.3 6.8 6.3 6.7  NEUTROABS 4.6 4.6 4.2 3.7 4.1  HGB 15.9* 16.0* 14.7 14.8 14.8  HCT 51.1* 50.1* 47.6* 47.7* 47.7*  MCV 90.3 90.4 91.5 93.0 91.9  PLT 112* 113* 99* 118* 130*   Cardiac Enzymes:  Recent Labs Lab 12/04/13 0250 12/04/13 0745 12/04/13 1248  TROPONINI 0.32* 0.31* <0.30   BNP (last 3 results)  Recent Labs  12/04/13 0940  PROBNP 2719.0*   CBG:  Recent Labs Lab 12/06/13 1711 12/07/13 0755 12/07/13 1133 12/07/13 1614 12/08/13 0802  GLUCAP 77 112* 90 109* 100*    Recent Results (from the past 240 hour(s))  MRSA PCR Screening     Status: None   Collection Time: 12/03/13 11:06 PM  Result Value Ref Range Status   MRSA by PCR NEGATIVE NEGATIVE Final    Comment:        The GeneXpert MRSA Assay (FDA approved for NASAL specimens only), is one component of a comprehensive MRSA colonization surveillance program. It is not intended to diagnose MRSA infection nor to guide or monitor treatment for MRSA infections.   Culture, blood (routine x 2)     Status: None (Preliminary result)   Collection Time: 12/04/13 11:20 AM  Result Value Ref Range Status   Specimen Description BLOOD LEFT HAND  Final   Special Requests BOTTLES DRAWN AEROBIC ONLY 3CC  Final   Culture  Setup Time   Final    12/04/2013  17:05 Performed at [facility]    Culture   Final           BLOOD CULTURE RECEIVED NO GROWTH TO DATE CULTURE WILL BE HELD FOR 5 DAYS BEFORE ISSUING A FINAL NEGATIVE REPORT Performed at [facility]    Report Status PENDING  Incomplete  Culture, blood (routine x 2)     Status: None (Preliminary result)   Collection Time: 12/04/13 11:30 AM  Result Value Ref Range Status   Specimen Description BLOOD LEFT FOREARM  Final   Special Requests BOTTLES DRAWN AEROBIC ONLY 1.5CC  Final   Culture  Setup Time   Final    12/04/2013 17:05 Performed at [facility]    Culture   Final           BLOOD CULTURE RECEIVED NO GROWTH TO DATE CULTURE WILL BE HELD FOR 5 DAYS BEFORE ISSUING A FINAL NEGATIVE REPORT Performed at [facility]    Report Status PENDING  Incomplete  Urine culture     Status: None   Collection Time: 12/04/13  6:10 PM  Result Value Ref Range Status   Specimen Description URINE, CLEAN CATCH  Final  Special Requests NONE  Final   Culture  Setup Time   Final    12/05/2013 00:16 Performed at [place] Performed at [facility]   Final   Culture NO GROWTH Performed at [facility]   Final   Report Status 12/05/2013 FINAL  Final  Clostridium Difficile by PCR     Status: Abnormal   Collection Time: 12/05/13  9:00 PM  Result Value Ref Range Status   C difficile by pcr POSITIVE (A) NEGATIVE Final    Comment: CRITICAL RESULT CALLED TO, READ BACK BY AND VERIFIED WITH: Gertha Calkin RN 11:05 12/06/13 ([email or web address])      Studies:  Recent x-ray studies have been reviewed in detail by the Attending Physician  Scheduled Meds:  Scheduled Meds: . aspirin EC  325 mg Oral Daily  . budesonide-formoterol  2 puff Inhalation BID  . folic acid  1 mg Oral Daily  . heparin  5,000 Units Subcutaneous 3 times per day  . ipratropium  0.5 mg Nebulization Q6H  . levalbuterol  0.63 mg Nebulization Q6H  .  multivitamin with minerals  1 tablet Oral Daily  . regadenoson  0.4 mg Intravenous Once  . sodium chloride  3 mL Intravenous Q12H  . thiamine  100 mg Oral Daily  . vancomycin  125 mg Oral QID    Time spent on care of this patient: 40 mins   Allie Bossier , MD   [facility] Office  [phone] Pager [phone]  On-Call/Text Page:      [email or web address]      password [email or web address]  If 7PM-7AM, please contact night-coverage [email or web address] Password [email or web address] 12/08/2013, 1:53 PM   LOS: 5 days

## 2013-12-09 LAB — CBC WITH DIFFERENTIAL/PLATELET
BASOS ABS: 0 10*3/uL (ref 0.0–0.1)
Basophils Relative: 0 % (ref 0–1)
EOS PCT: 8 % — AB (ref 0–5)
Eosinophils Absolute: 0.5 10*3/uL (ref 0.0–0.7)
HEMATOCRIT: 46.8 % — AB (ref 36.0–46.0)
Hemoglobin: 14.8 g/dL (ref 12.0–15.0)
LYMPHS PCT: 26 % (ref 12–46)
Lymphs Abs: 1.4 10*3/uL (ref 0.7–4.0)
MCH: 28.8 pg (ref 26.0–34.0)
MCHC: 31.6 g/dL (ref 30.0–36.0)
MCV: 91.1 fL (ref 78.0–100.0)
Monocytes Absolute: 0.5 10*3/uL (ref 0.1–1.0)
Monocytes Relative: 10 % (ref 3–12)
NEUTROS ABS: 3.1 10*3/uL (ref 1.7–7.7)
Neutrophils Relative %: 56 % (ref 43–77)
PLATELETS: 140 10*3/uL — AB (ref 150–400)
RBC: 5.14 MIL/uL — ABNORMAL HIGH (ref 3.87–5.11)
RDW: 17.5 % — AB (ref 11.5–15.5)
WBC: 5.5 10*3/uL (ref 4.0–10.5)

## 2013-12-09 LAB — COMPREHENSIVE METABOLIC PANEL
ALT: 19 U/L (ref 0–35)
ANION GAP: 10 (ref 5–15)
AST: 21 U/L (ref 0–37)
Albumin: 2.3 g/dL — ABNORMAL LOW (ref 3.5–5.2)
Alkaline Phosphatase: 68 U/L (ref 39–117)
BUN: 9 mg/dL (ref 6–23)
CALCIUM: 9.3 mg/dL (ref 8.4–10.5)
CHLORIDE: 100 meq/L (ref 96–112)
CO2: 32 meq/L (ref 19–32)
CREATININE: 0.73 mg/dL (ref 0.50–1.10)
GFR calc non Af Amer: 85 mL/min — ABNORMAL LOW (ref >90)
GLUCOSE: 84 mg/dL (ref 70–99)
Potassium: 4.6 mEq/L (ref 3.7–5.3)
Sodium: 142 mEq/L (ref 137–147)
Total Bilirubin: 0.5 mg/dL (ref 0.3–1.2)
Total Protein: 6.1 g/dL (ref 6.0–8.3)

## 2013-12-09 LAB — MAGNESIUM: MAGNESIUM: 1.8 mg/dL (ref 1.5–2.5)

## 2013-12-09 LAB — GLUCOSE, CAPILLARY: Glucose-Capillary: 86 mg/dL (ref 70–99)

## 2013-12-09 MED ORDER — ADULT MULTIVITAMIN W/MINERALS CH: 1.0000 | ORAL_TABLET | Freq: Every day | ORAL | Status: AC

## 2013-12-09 MED ORDER — COLCHICINE 0.6 MG PO TABS: 0.6000 mg | ORAL_TABLET | Freq: Every day | ORAL | Status: AC

## 2013-12-09 MED ORDER — FOLIC ACID 1 MG PO TABS: 1.0000 mg | ORAL_TABLET | Freq: Every day | ORAL | Status: AC

## 2013-12-09 MED ORDER — ASPIRIN 325 MG PO TBEC: 325.0000 mg | DELAYED_RELEASE_TABLET | Freq: Every day | ORAL | Status: AC

## 2013-12-09 MED ORDER — THIAMINE HCL 100 MG PO TABS: 100.0000 mg | ORAL_TABLET | Freq: Every day | ORAL | Status: AC

## 2013-12-09 MED ORDER — VANCOMYCIN 50 MG/ML ORAL SOLUTION: 125.0000 mg | Freq: Four times a day (QID) | ORAL | Status: AC

## 2013-12-09 NOTE — Care Management Note (Signed)
CARE MANAGEMENT NOTE 12/09/2013  Patient:  Angela Baldwin,Angela Baldwin   Account Number:  [id number]  Date Initiated:  12/04/2013  Documentation initiated by:  Junius CreamerWELL,DEBBIE  Subjective/Objective Assessment:   adm w cholecystitis, hypoxia     Action/Plan:   lives alone, pcp dr Reuel Boomdaniel hall, has aide per chart, uses home o2 per chart   Anticipated DC Date:  12/09/2013   Anticipated DC Plan:  HOME W HOME HEALTH SERVICES         Choice offered to / List presented [facility]:          [facility]  [facility]  HH-2 PT      Status of service:  Completed, signed off Medicare Important Message given?  YES (If response is "NO", the following Medicare IM given date fields will be blank) Date Medicare IM given:  12/09/2013 Medicare IM given by:  Shayley Medlin Date Additional Medicare IM given:   Additional Medicare IM given by:    Discharge Disposition:  HOME W HOME HEALTH SERVICES  Per UR Regulation:  Reviewed for med. necessity/level of care/duration of stay  If discussed at Long Length of Stay Meetings, dates discussed:    Comments:   Home Health arranged with [facility] ph [phone]   Fax [phone]

## 2013-12-09 NOTE — Discharge Summary (Signed)
Physician Discharge Summary  Angela Baldwin ZPH:[id number] DOB: 14-Feb-1945 DOA: 12/03/2013  PCP: Derek Jack, MD  Admit date: 12/03/2013 Discharge date: 12/09/2013  Time spent: 45 minutes  Recommendations for Outpatient Follow-up:  Patient will be discharged to home with home health physical therapy.  She is to follow up with her primary care physician within one week of discharge. Patient will need follow-up with her cardiologist as well as surgeon closer to home within 1-2 weeks of discharge. Patient to continue her medications as prescribed. Patient was strongly urged to have another sleep study conducted however patient refuses. Patient is very heart healthy diet. Patient may resume activity as tolerated.   Discharge Diagnoses:  Acute on chronic respiratory failure with hypoxia and hypercapnia COPD Pulmonary edema/pleural effusion Dilated Cardiomyopathy Demand ischemia Pulmonary hypertension Pericardial effusion Acute calculous cholecystitis [place]omicide opinion Acute renal failure Hyperkalemia Diarrhea C. difficile colitis Bilateral ankle pain secondary to OA and gout flare  Discharge Condition: Stable  Diet recommendation: Healthy  Filed Weights   12/03/13 2300 12/09/13 0105  Weight: 115.4 kg (254 lb 6.6 oz) 111.3 kg (245 lb 6 oz)    History of present illness:  On 12/03/2013 by Dr. Heidi Dach Angela Baldwin is a 68 y.o. female presents as a transfer for abdominal pain. Patient states that she has been having pain in her abdomen since last week. Patient states that associated with this there is diarrhea. 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 She states that the pain is located in the right upper quadrant area. She states that she has no fevers noted. She states that she has no chest pain noted. In the ED at [facility] she was noted to have elevated troponin levels. In addition she has a history of CAD. As noted she has no chest pain  at this time. She also admits a history of COPD and she is on oxygen at home. Patient was on 5lpm flow and has SaO2 of 89-90%. She is hemodynamically stable at this time. On her initial workup it appears that she has gallstones and cholecystitis.  Hospital Course:  Acute on chronic respiratory failure with hypoxia/Hypercapnia: -12/4 Subsequently with marginal diuresis and given need for elevated preload in setting of RV dilatation will discontinue Lasix for now -Patient's appears at baseline able to carry on conversation without distress -Patient has had previous polysomnogram (records unavailable) in [place], and per patient was negative for OSA although her description of procedure sounds as if they placed a self titrating CPAP mask. -Recommend repeating polysomnogram in the [facility] system upon discharge- however patient adamantly refused.  COPD (chronic obstructive pulmonary disease) -Continue Symbicort, Xopenex QID, ipratropium QID  Pulmonary edema/pleural effusion -See acute on chronic respiratory failure  Cardiomyopathy dilated -Cardiology recommended Lexis scan Myoview study as an outpatient   Demand ischemia -minimal elevation in troponin which has now decreased to normal -EKG nonischemic -Cardiac catheterization April 2015 with nonobstructive CAD  Pulmonary HTN -LVEF was normal on cardiac catheterization April 2015 -Has documented history of pulmonary hypertension; echocardiogram shows right heart failure, see results below -ProBNP only mildly elevated at 2719 -Urinary strep negative, urinary Legionella antigen negative: stop all antibiotics   Pericardial effusion -Documented on CT scan and reported as increasing in size as compared to previous scan on 10/28/13 -Echocardiogram shows mild-moderate effusion, no recommendations from cardiology for any invasive procedure.  Acute calculous cholecystitis -HIDA scan; suspicious for obstruction see results below. Surgery  believes patient needs  laparoscopic cholecystectomy.  -Surgery after cardiology evaluation does not need urgent cholecystectomy at this time. -Lipase normal and no evidence of pancreatitis  Thrombocytopenia -Improving  Acute renal failure -Baseline renal function unknown -Labs from outside hospital with BUN 50 creatinine 1.6 -Resolved; BUN and creatinine 13/ 0.76  Hyperkalemia -Resolved  Diarrhea -No further diarrhea since admission  C. difficile colitis -Patient with reported diarrhea prior to transport to this facility and apparent prior treatment for C. difficile colitis but unclear exactly when this was and if this was her first episode of treatment -Continue PO vancomycin 125 mg QID 14 days  Bilateral ankle pain secondary to OA/ gout flare  -Ankle X-Rays showed swelling and arthritis -Uric Acid 7.8, CRP 5.4, ESR NL, RF negative -Continue Colchicine 0.6 mg Daily  Procedure/Significant Events: 12/2 Echocardiogrm;Left ventricle: mild LVH. LVEF=55%. - Right ventricle: severely dilated. Systolic function was severely reduced. - Right atrium: moderately dilated. - Pulmonary arteries: PA peak pressure: 72 mm Hg (S). - Pericardium, extracardiac: There is a small/moderate circumferential pericardial effusion. 12/2 HIDA Scan:Distended gallbladder; Suspicious for common bile duct obstruction without cystic duct obstruction.  12/5 Rt/Lt Ankle X-Ray; Diffuse ankle soft tissue swelling-No gross malalignment or fracture; Rt Ankle Arthritis at the talonavicular joint. Minimal tibiotalar arthritis. Small heel spur  Consultations: Dr. Ena Dawley (Cardiology) Dr. Rolm Bookbinder ([place])  Discharge Exam: Filed Vitals:   12/09/13 0500  BP: 137/82  Pulse: 74  Temp: 97.9 F (36.6 C)  Resp: 18     General: Well developed, well nourished, NAD, appears stated age  HEENT: NCAT, mucous membranes moist.  Cardiovascular: S1 S2 auscultated, Regular rate and  rhythm.  Respiratory: Mild diffuse wheezing  Abdomen: Soft, obese, nontender, nondistended, + bowel sounds  Extremities: warm dry without cyanosis clubbing.  Tenderness to palpation of the ankles bilaterally with mild effusions  Neuro: AAOx3, no focal deficits  Psych: Appropriate mood and affect  Discharge Instructions      Discharge Instructions    Discharge instructions    Complete by:  As directed   Patient will be discharged to home with home health physical therapy.  She is to follow up with her primary care physician within one week of discharge. Patient to continue her medications as prescribed. Patient was strongly urged to have another sleep study conducted however patient refuses. Patient is very heart healthy diet. Patient may resume activity as tolerated.            Medication List    STOP taking these medications        indomethacin 50 MG capsule  Commonly known as:  INDOCIN     loperamide 2 MG capsule  Commonly known as:  IMODIUM      TAKE these medications        acetaminophen-codeine 300-60 MG per tablet  Commonly known as:  TYLENOL #4  Take 2 tablets by mouth every 4 (four) hours as needed for moderate pain.     allopurinol 300 MG tablet  Commonly known as:  ZYLOPRIM  Take 300 mg by mouth daily. For Gout     aspirin 325 MG EC tablet  Take 1 tablet (325 mg total) by mouth daily.     budesonide-formoterol 160-4.5 MCG/ACT inhaler  Commonly known as:  SYMBICORT  Inhale 2 puffs into the lungs 2 (two) times daily.     colchicine 0.6 MG tablet  Take 1 tablet (0.6 mg total) by mouth daily.     cyclobenzaprine 10 MG tablet  Commonly known as:  FLEXERIL  Take 10 mg by mouth 2 (two) times daily as needed for muscle spasms.     folic acid 1 MG tablet  Commonly known as:  FOLVITE  Take 1 tablet (1 mg total) by mouth daily.     mirtazapine 15 MG tablet  Commonly known as:  REMERON  Take 15 mg by mouth at bedtime.     multivitamin with minerals  Tabs tablet  Take 1 tablet by mouth daily.     thiamine 100 MG tablet  Take 1 tablet (100 mg total) by mouth daily.     vancomycin 50 mg/mL oral solution  Commonly known as:  VANCOCIN  Take 2.5 mLs (125 mg total) by mouth 4 (four) times daily.       No Known Allergies Follow-up Information    Follow up with HALL,DANIEL, MD. Schedule an appointment as soon as possible for a visit in 1 week.   Specialty:  Family Medicine   Why:  Hospital follow-up   Contact information:   [place] [id number]       Follow up with Tyrone Schimke, MD.   Specialty:  Internal Medicine   Contact information:   [address] [place] [address] [phone]        The results of significant diagnostics from this hospitalization (including imaging, microbiology, ancillary and laboratory) are listed below for reference.    Significant Diagnostic Studies: Dg Ankle Complete Left  2013-12-20   CLINICAL DATA:  Arthritis, acute swelling and pain of the ankles.  EXAM: LEFT ANKLE COMPLETE - 3+ VIEW  COMPARISON:  December 20, 2013  FINDINGS: Diffuse ankle soft tissue swelling. Limited lateral view because of positioning. No gross malalignment or fracture. Distal tibia, fibula, talus and calcaneus appear intact.  IMPRESSION: Soft tissue swelling without acute osseous finding   Electronically Signed   By: Daryll Brod M.D.   On: 20-Dec-2013 14:45   Dg Ankle Complete Right  12-20-2013   CLINICAL DATA:  Initial evaluation for bilateral ankle pain, arthritis redness and swelling  EXAM: RIGHT ANKLE - COMPLETE 3+ VIEW  COMPARISON:  None.  FINDINGS: Mild to moderate diffuse soft tissue swelling particularly laterally. No fracture or dislocation. No periosteal reaction. Mortise is intact. Arthritis at the talonavicular joint. Minimal tibiotalar arthritis. Small heel spur.  IMPRESSION: Soft-tissue swelling and arthritic change with no acute findings   Electronically Signed   By: Skipper Cliche M.D.   On: 20-Dec-2013 14:46   Nm Hepatobiliary Including Gb  12/04/2013   CLINICAL DATA:  Right upper quadrant abdominal pain. Transferred from an outside hospital with suspected acute cholecystitis.  EXAM: NUCLEAR MEDICINE HEPATOBILIARY IMAGING  TECHNIQUE: Sequential images of the abdomen were obtained out to 60 minutes following intravenous administration of radiopharmaceutical.  RADIOPHARMACEUTICALS:  5.0 [place] AL-93X Choletec  COMPARISON:  No correlating imaging studies.  FINDINGS: There is fairly symmetric uptake in the liver. The biliary tree is visualized by approximately 20 min. The gallbladder is visualized at 25 min. The gallbladder appears dilated. Small bowel activity was never demonstrated. Findings suspicious for common bile duct obstruction without cystic duct obstruction.  IMPRESSION: Distended gallbladder without visualization of the small bowel. Findings suspicious for common bile duct obstruction without cystic duct obstruction.   Electronically Signed   By: Kalman Jewels M.D.   On: 12/04/2013 18:04   Portable Chest 1 View  12/04/2013   CLINICAL DATA:  CHF.  EXAM: PORTABLE CHEST - 1 VIEW  COMPARISON:  None.  FINDINGS: Cardiomegaly pulmonary vascular congestion and bilateral pulmonary infiltrates noted consistent congestive heart failure. Tiny pleural effusions cannot be excluded. No pneumothorax. No acute osseus abnormality  IMPRESSION: Congestive heart failure with pulmonary edema.   Electronically Signed   By: Marcello Moores  Register   On: 12/04/2013 07:02    Microbiology: Recent Results (from the past 240 hour(s))  MRSA PCR Screening     Status: None   Collection Time: 12/03/13 11:06 PM  Result Value Ref Range Status   MRSA by PCR NEGATIVE NEGATIVE Final    Comment:        The GeneXpert MRSA Assay (FDA approved for NASAL specimens only), is one component of a comprehensive MRSA colonization surveillance program. It is not intended to diagnose MRSA infection nor to  guide or monitor treatment for MRSA infections.   Culture, blood (routine x 2)     Status: None (Preliminary result)   Collection Time: 12/04/13 11:20 AM  Result Value Ref Range Status   Specimen Description BLOOD LEFT HAND  Final   Special Requests BOTTLES DRAWN AEROBIC ONLY 3CC  Final   Culture  Setup Time   Final    12/04/2013 17:05 Performed at [facility]    Culture   Final           BLOOD CULTURE RECEIVED NO GROWTH TO DATE CULTURE WILL BE HELD FOR 5 DAYS BEFORE ISSUING A FINAL NEGATIVE REPORT Performed at [facility]    Report Status PENDING  Incomplete  Culture, blood (routine x 2)     Status: None (Preliminary result)   Collection Time: 12/04/13 11:30 AM  Result Value Ref Range Status   Specimen Description BLOOD LEFT FOREARM  Final   Special Requests BOTTLES DRAWN AEROBIC ONLY 1.5CC  Final   Culture  Setup Time   Final    12/04/2013 17:05 Performed at [facility]    Culture   Final           BLOOD CULTURE RECEIVED NO GROWTH TO DATE CULTURE WILL BE HELD FOR 5 DAYS BEFORE ISSUING A FINAL NEGATIVE REPORT Performed at [facility]    Report Status PENDING  Incomplete  Urine culture     Status: None   Collection Time: 12/04/13  6:10 PM  Result Value Ref Range Status   Specimen Description URINE, CLEAN CATCH  Final   Special Requests NONE  Final   Culture  Setup Time   Final    12/05/2013 00:16 Performed at [place] Performed at [facility]   Final   Culture NO GROWTH Performed at [facility]   Final   Report Status 12/05/2013 FINAL  Final  Clostridium Difficile by PCR     Status: Abnormal   Collection Time: 12/05/13  9:00 PM  Result Value Ref Range Status   C difficile by pcr POSITIVE (A) NEGATIVE Final    Comment: CRITICAL RESULT CALLED TO, READ BACK BY AND VERIFIED WITH: P. SPRAMOSKI RN 11:05 12/06/13 ([email or web address])      Labs: Basic Metabolic Panel:  Recent Labs Lab  12/05/13 1550 12/06/13 0324 12/07/13 0311 12/08/13 0355 12/09/13 0604  NA 141 140 144 142 142  K 5.6* 4.0 4.3 4.3 4.6  CL 100 100 102 103 100  CO2 29 28 32 29 32  GLUCOSE 118* 93 98 88 84  BUN 27* 26* _0 CREATININE 1.04 1.12* 0.91 0.76 0.73  CALCIUM 8.9 8.8 9.0  9.0 9.3  MG 1.9 1.7 1.9 1.9 1.8   Liver Function Tests:  Recent Labs Lab 12/05/13 1550 12/06/13 0324 12/07/13 0311 12/08/13 0355 12/09/13 0604  AST 41* _0 ALT 44* 34 _1 ALKPHOS 86 73 88 70 68  BILITOT 0.9 0.8 0.5 0.7 0.5  PROT 6.3 5.8* 5.6* 5.7* 6.1  ALBUMIN 2.8* 2.5* 2.3* 2.3* 2.3*    Recent Labs Lab 12/04/13 0940  LIPASE 35   No results for input(s): AMMONIA in the last 168 hours. CBC:  Recent Labs Lab 12/05/13 1550 12/06/13 0324 12/07/13 0311 12/08/13 0355 12/09/13 0910  WBC 7.3 6.8 6.3 6.7 5.5  NEUTROABS 4.6 4.2 3.7 4.1 3.1  HGB 16.0* 14.7 14.8 14.8 14.8  HCT 50.1* 47.6* 47.7* 47.7* 46.8*  MCV 90.4 91.5 93.0 91.9 91.1  PLT 113* 99* 118* 130* 140*   Cardiac Enzymes:  Recent Labs Lab 12/04/13 0250 12/04/13 0745 12/04/13 1248  TROPONINI 0.32* 0.31* <0.30   BNP: BNP (last 3 results)  Recent Labs  12/04/13 0940  PROBNP 2719.0*   CBG:  Recent Labs Lab 12/07/13 0755 12/07/13 1133 12/07/13 1614 12/08/13 0802 12/09/13 0744  GLUCAP 112* 90 109* 100* 86       Signed:  Brion Sossamon  [facility] 12/09/2013, 1:20 PM

## 2013-12-09 NOTE — Care Management Note (Signed)
CARE MANAGEMENT NOTE 12/09/2013  Patient:  Angela Baldwin, Angela Baldwin   Account Number:  [id number]  Date Initiated:  12/04/2013  Documentation initiated by:  Elissa Hefty  Subjective/Objective Assessment:   adm w cholecystitis, hypoxia     Action/Plan:   lives alone, pcp dr Quillian Quince [place], has aide per chart, uses home o2 per chart   Anticipated DC Date:  12/09/2013   Anticipated DC Plan:  [place]         Choice offered to / List presented to:          [facility] arranged  HH-1 RN  HH-2 PT      Status of service:  Completed, signed off Medicare Important Message given?  YES (If response is "NO", the following Medicare IM given date fields will be blank) Date Medicare IM given:  12/09/2013 Medicare IM given by:  [name] Date Additional Medicare IM given:   Additional Medicare IM given by:    Discharge Disposition:  [place]  Per UR Regulation:  Reviewed for med. necessity/level of care/duration of stay  If discussed at [place] of Stay Meetings, dates discussed:    Comments:  12/09/2013  4:55pm, this CM was informed by the pt RN Birdena Jubilee that the family had arrived to take the pt home and had forgotten to  bring the oxygen tank. This CM immediately called [facility] to request assistance with this pt , while the pt is not a client of [facility], this CM requested their assistance with this issue as the family had driven 1.5 hr to arrive at the hospital. [facility] rep, Joellen [place] R stated that they would try to assist. This CM then notifed [place] AD, Case management of the situation @ 4:58pm. Upon the end of the converstation , this CM was informed by the charge RN that the pt has insisted on leaving without oxygen and her family had agreed. I informed Delanna Ahmadi as we were still in conversation at 5:05pm, and then called [facility] to notifiy them that the pt had left the hospital. The [facility] line has been rolled over to the main office and this required some explaination to the  answering staff, as I attempted to stop the delivery of the tank. They stated that they would attempt to send a message to [facility] rep for [facility]. In the meantime per the nurse tech,Sydney Bowser , [facility] rep arrived on the unit with an oxygen tank only minutes after the pt was rolling on the elevator to leave the hospital per her wishes, even with the RN, Tyna Jaksch stating that we were makiing arrangements for oxygen for her trip home.    Jasmine Pang RN MPH, case manager, [phone]      12/09/2013 Met with pt this am and again in pm to discuss [place] needs. Pt has PCS with hh aide about 4hr per day, is on home oxygen that she gets from [place] [facility] in [place] and does physical therapy as an outpatient at the local hospital. Pt was worried earlier that she had lost her oxygen tank however told this CM that she had spoke with her daughter and that her daughter took the tank home. This CM reminded the pt that she would need to have her family bring the oxygen tank when they come to pick her up to travel home, as she plans to d/c to home with family by car. The pt stated that her daughter would bring the tank. Met with  the pt again to discuss HHPT needs, this pt states again that she goes to the hospital and has not previouly had HHPT except when she had her knee surgery and she has forgotten her provider at that time. This CM searched the area and was able to find HHPT services with [place] who will contact the pt at home to arrange a start time. The pt was informed of this.   CRoyal RN MPH, case manager, [phone]

## 2013-12-09 NOTE — Evaluation (Signed)
Physical Therapy Evaluation Patient Details Name: Angela Baldwin MRN: [id number] DOB: [birth year] Today's Date: 12/09/2013   History of Present Illness  Pt is a 68 y.o. female presents as a transfer for abdominal pain. Patient states that she has been having pain in her abdomen since last week. Patient states that associated with this there is diarrhea. 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 In the ED at previous hospital she was noted to have elevated troponin levels. In addition she has a history of CAD. As noted she has no chest pain at this time. She also admits a history of COPD and she is on oxygen at home. Patient was on 5L/min flow and has SaO2 of 89-90%. She is hemodynamically stable at this time. On her initial workup it appears that she has gallstones and cholecystitis.    Clinical Impression  Pt admitted with the above. Pt currently with functional limitations due to the deficits listed below (see PT Problem List). At the time of PT eval pt was able to perform transfers and ambulation with min guard assist to supervision. Pt reports walking short distances at home without her O2. After ambulating ~25 feet on RA in room, sats were in the high 50's. Pt reports slight SOB but no other symptoms at that time. Discussed pursed-lip breathing and use of O2 at all times with pt.   Pt will benefit from skilled PT to increase their independence and safety with mobility to allow discharge to the venue listed below.  Pt reports that she has the assistance she requires at home with aides coming 7 days/week. Feel that pt is safe to d/c home, but will need 24 hour assist at least until tolerance for functional activity improves.      Follow Up Recommendations Home health PT;Supervision/Assistance - 24 hour    Equipment Recommendations  None recommended by PT    Recommendations for Other Services       Precautions / Restrictions Precautions Precautions:  Fall Restrictions Weight Bearing Restrictions: No      Mobility  Bed Mobility Overal bed mobility: Needs Assistance Bed Mobility: Supine to Sit     Supine to sit: Supervision     General bed mobility comments: Pt was able to transition to EOB with use of bed rail and supervision for safety.   Transfers Overall transfer level: Needs assistance Equipment used: Rolling walker (2 wheeled) Transfers: Sit to/from Stand Sit to Stand: Supervision         General transfer comment: Pt able to power up to full standing position without physical assist. Supervision for safety.   Ambulation/Gait Ambulation/Gait assistance: Min guard Ambulation Distance (Feet): 25 Feet Assistive device: Rolling walker (2 wheeled) Gait Pattern/deviations: Decreased stride length;Trendelenburg Gait velocity: Decreased Gait velocity interpretation: Below normal speed for age/gender General Gait Details: Pt was able to ambulate in the room with close guard for safety. Pt states that at home she takes her O2 off to ambulate to the bathroom, and only increases the O2 (to 10L/min) when leaving the house. On RA sats dropped into the high 50's after 25 feet. Pt reports slight SOB but no other symptoms at this time.   Stairs            Wheelchair Mobility    Modified Rankin (Stroke Patients Only)       Balance Overall balance assessment: Needs assistance Sitting-balance support: Feet supported;No upper extremity supported Sitting balance-Leahy Scale: Good  Standing balance support: Bilateral upper extremity supported;During functional activity Standing balance-Leahy Scale: Poor Standing balance comment: Pt requires UE support for balance and energy conservation.                              Pertinent Vitals/Pain Pain Assessment: No/denies pain    Home Living Family/patient expects to be discharged to:: Private residence Living Arrangements: Alone Available Help at Discharge:  Personal care attendant Type of Home: House Home Access: Ramped entrance     Home Layout: One level Home Equipment: Environmental consultantWalker - 2 wheels;Electric scooter;Bedside commode;Cane - single point;Hospital bed;Shower seat (Home O2)      Prior Function Level of Independence: Needs assistance   Gait / Transfers Assistance Needed: Used RW all the time  ADL's / Homemaking Assistance Needed: Aides assisted with bathing and dressing. Cooking and cleaning taken are of by the aide as well. Aides take her for grocery shopping.        Hand Dominance   Dominant Hand: Right    Extremity/Trunk Assessment   Upper Extremity Assessment: Defer to OT evaluation           Lower Extremity Assessment: Generalized weakness (Did not MMT due to pain from gout flare up)      Cervical / Trunk Assessment: Normal  Communication   Communication: No difficulties  Cognition Arousal/Alertness: Awake/alert Behavior During Therapy: WFL for tasks assessed/performed Overall Cognitive Status: Within Functional Limits for tasks assessed                      General Comments      Exercises        Assessment/Plan    PT Assessment Patient needs continued PT services  PT Diagnosis Difficulty walking;Generalized weakness   PT Problem List Decreased strength;Decreased range of motion;Decreased activity tolerance;Decreased balance;Decreased mobility;Decreased knowledge of use of DME;Decreased safety awareness;Decreased knowledge of precautions;Cardiopulmonary status limiting activity  PT Treatment Interventions DME instruction;Gait training;Stair training;Functional mobility training;Therapeutic activities;Therapeutic exercise;Neuromuscular re-education;Patient/family education   PT Goals (Current goals can be found in the Care Plan section) Acute Rehab PT Goals Patient Stated Goal: Return home independently PT Goal Formulation: With patient Time For Goal Achievement: 12/23/13 Potential to Achieve  Goals: Good    Frequency Min 3X/week   Barriers to discharge        Co-evaluation               End of Session Equipment Utilized During Treatment: Gait belt;Oxygen Activity Tolerance: Patient limited by fatigue Patient left: in chair;with call bell/phone within reach Nurse Communication: Mobility status         Time: 1103-1130 PT Time Calculation (min) (ACUTE ONLY): 27 min   Charges:   PT Evaluation $Initial PT Evaluation Tier I: 1 Procedure PT Treatments $Gait Training: 8-22 mins $Therapeutic Activity: 8-22 mins   PT G Codes:          Conni SlipperKirkman, Sophronia Varney 12/09/2013, 12:35 PM  Conni SlipperLaura Ayshia Gramlich, PT, DPT Acute Rehabilitation Services Pager: [phone]

## 2013-12-09 NOTE — Discharge Instructions (Signed)
Acute Respiratory Failure °Respiratory failure is when your lungs are not working well and your breathing (respiratory) system fails. When respiratory failure occurs, it is difficult for your lungs to get enough oxygen, get rid of carbon dioxide, or both. Respiratory failure can be life threatening.  °Respiratory failure can be acute or chronic. Acute respiratory failure is sudden, severe, and requires emergency medical treatment. Chronic respiratory failure is less severe, happens over time, and requires ongoing treatment.  °WHAT ARE THE CAUSES OF ACUTE RESPIRATORY FAILURE?  °Any problem affecting the heart or lungs can cause acute respiratory failure. Some of these causes include the following: °· Chronic bronchitis and emphysema (COPD).   °· Blood clot going to a lung (pulmonary embolism).   °· Having water in the lungs caused by heart failure, lung injury, or infection (pulmonary edema).   °· Collapsed lung (pneumothorax).   °· Pneumonia.   °· Pulmonary fibrosis.   °· Obesity.   °· Asthma.   °· Heart failure.   °· Any type of trauma to the chest that can make breathing difficult.   °· Nerve or muscle diseases making chest movements difficult. °WHAT SYMPTOMS SHOULD YOU WATCH FOR?  °If you have any of these signs or symptoms, you should seek immediate medical care:  °· You have shortness of breath (dyspnea) with or without activity.   °· You have rapid, fast breathing (tachypnea).   °· You are wheezing. °· You are unable to say more than a few words without having to catch your breath. °· You find it very difficult to function normally. °· You have a fast heart rate.   °· You have a bluish color to your finger or toe nail beds.   °· You have confusion or drowsiness or both.   °HOW WILL MY ACUTE RESPIRATORY FAILURE BE TREATED?  °Treatment of acute respiratory failure depends on the cause of the respiratory failure. Usually, you will stay in the intensive care unit so your breathing can be watched closely. Treatment  can include the following: °· Oxygen. Oxygen can be delivered through the following: °¨ Nasal cannula. This is small tubing that goes in your nose to give you oxygen. °¨ Face mask. A face mask covers your nose and mouth to give you oxygen. °· Medicine. Different medicines can be given to help with breathing. These can include: °¨ Nebulizers. Nebulizers deliver medicines to open the air passages (bronchodilators). These medicines help to open or relax the airways in the lungs so you can breathe better. They can also help loosen mucus from your lungs. °¨ Diuretics. Diuretic medicines can help you breathe better by getting rid of extra water in your body. °¨ Steroids. Steroid medicines can help decrease swelling (inflammation) in your lungs. °¨ Antibiotics. °· Chest tube. If you have a collapsed lung (pneumothorax), a chest tube is placed to help reinflate the lung. °· Non-invasive positive pressure ventilation (NPPV). This is a tight-fitting mask that goes over your nose and mouth. The mask has tubing that is attached to a machine. The machine blows air into the tubing, which helps to keep the tiny air sacs (alveoli) in your lungs open. This machine allows you to breathe on your own. °· Ventilator. A ventilator is a breathing machine. When on a ventilator, a breathing tube is put into the lungs. A ventilator is used when you can no longer breathe well enough on your own. You may have low oxygen levels or high carbon dioxide (CO2) levels in your blood. When you are on a ventilator, sedation and pain medicines are given to make you sleep   so your lungs can heal. °Document Released: 12/25/2012 Document Revised: 05/06/2013 Document Reviewed: 12/25/2012 °[employer]® Patient Information ©2015 [employer]. This information is not intended to replace advice given to you by your health care provider. Make sure you discuss any questions you have with your health care provider. ° °

## 2013-12-09 NOTE — Progress Notes (Signed)
Patient provided with discharge instructions including activity level, diet, medications, follow up appointments, and new prescriptions. Pt verbalized understanding of all instruction. IV was dc'd. Pt on 5 L of O2 at rest. Despite being reminded to bring oxygen tank to pick up patient, pt's daughter forgot. Pt's daughter lives one hour and thirty minutes away and did not wish to return home. RN stated that case manager was working to get an oxygen tank to patient. Family and patient stated they did not wish to wait on tank and would drive home with no oxygen. RN strongly advised patient against this and urged family to wait. Patient refused. Educated on consequences of patient not having supplemental O2. RN escorted pt out.   Carrie MewJasmine Cadell Gabrielson, RN

## 2013-12-09 NOTE — Plan of Care (Signed)
Problem: Phase III Progression Outcomes Goal: Voiding independently Outcome: Completed/Met Date Met:  12/09/13 Goal: Foley discontinued Outcome: Not Applicable Date Met:  73/42/87 Goal: Discharge plan remains appropriate-arrangements made Outcome: Completed/Met Date Met:  12/09/13

## 2013-12-10 LAB — CULTURE, BLOOD (ROUTINE X 2)
CULTURE: NO GROWTH
Culture: NO GROWTH

## 2014-04-04 DEATH — deceased

## 2016-01-29 IMAGING — CR DG CHEST 1V PORT
1 series · 1 of 1 positions shown · non-contrast
Comparison: None.

CLINICAL DATA: CHF.

EXAM:
PORTABLE CHEST - 1 VIEW

[AP]
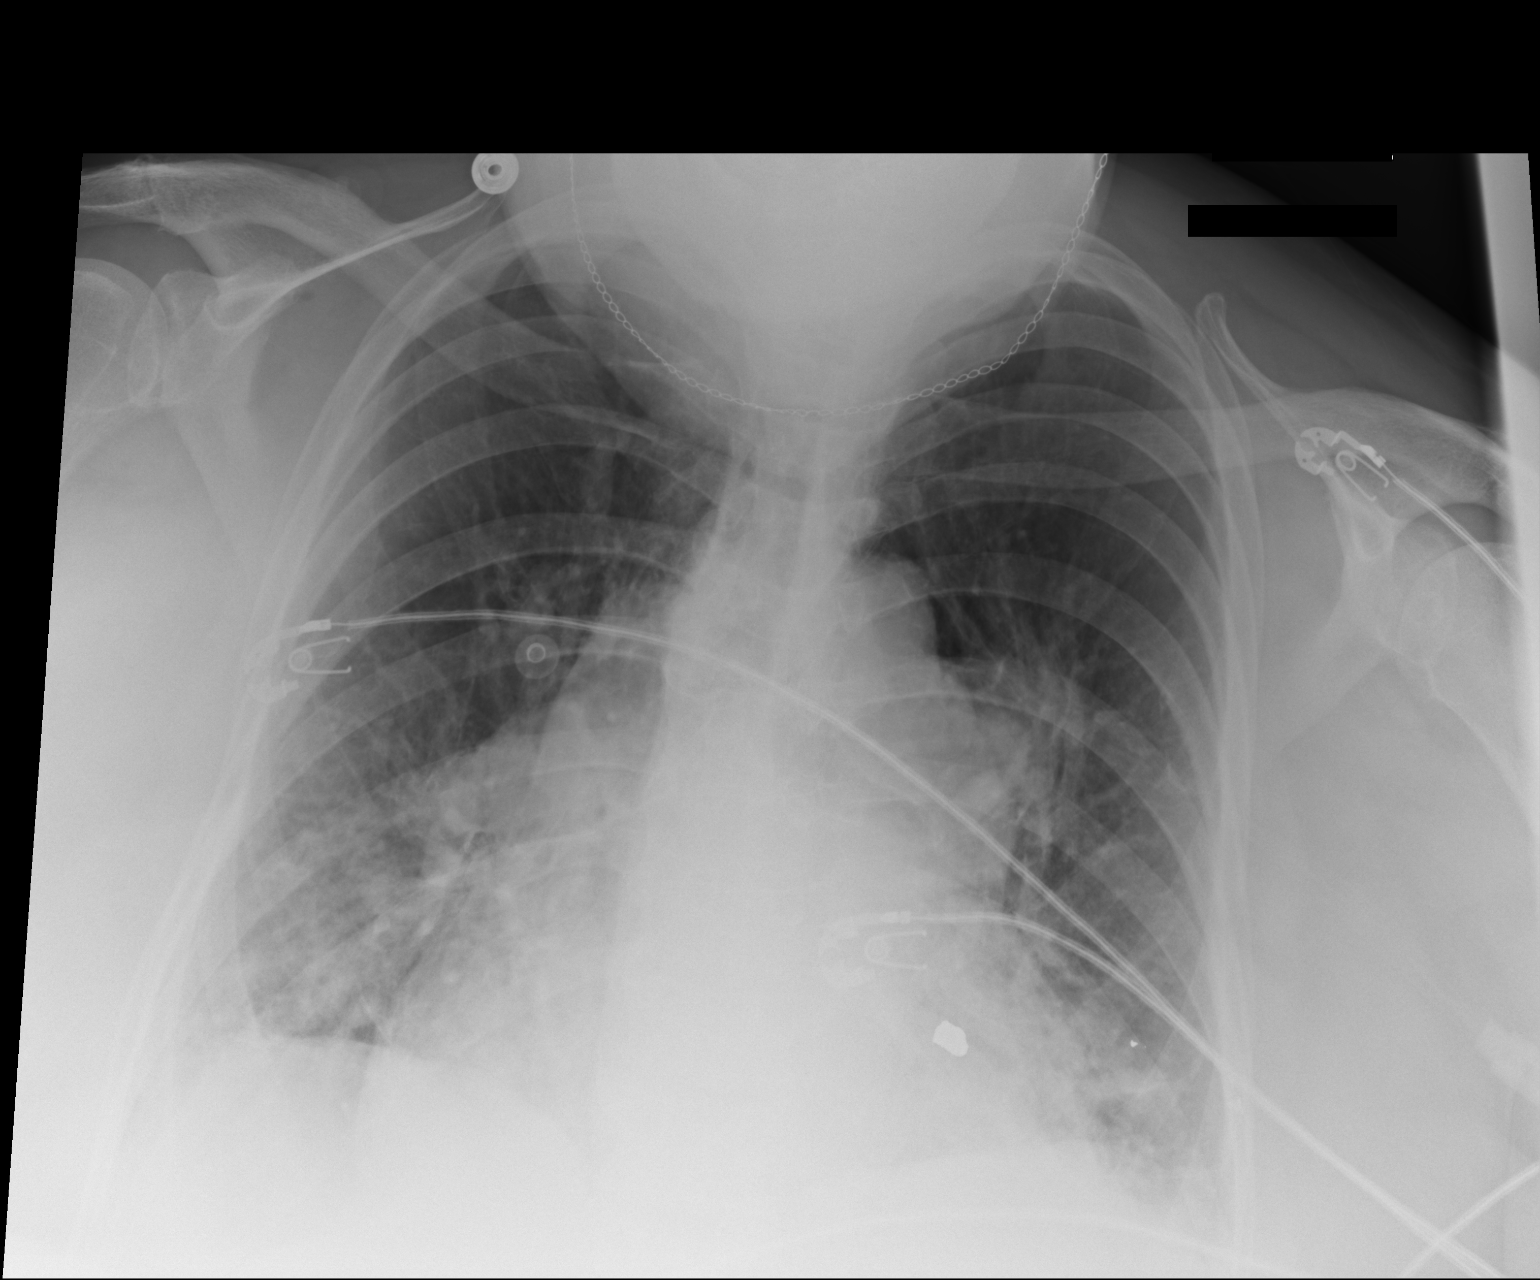

[1 of 1 positions shown; findings below may reference images not displayed]

FINDINGS: Cardiomegaly pulmonary vascular congestion and bilateral pulmonary
infiltrates noted consistent congestive heart failure. Tiny pleural
effusions cannot be excluded. No pneumothorax. No acute osseus
abnormality
IMPRESSION: Congestive heart failure with pulmonary edema.

## 2016-02-01 IMAGING — CR DG ANKLE COMPLETE 3+V*L*
3 series · 3 of 3 positions shown · non-contrast
Comparison: 12/07/2013

CLINICAL DATA: Arthritis, acute swelling and pain of the ankles.

EXAM:
LEFT ANKLE COMPLETE - 3+ VIEW

[ankle ap]
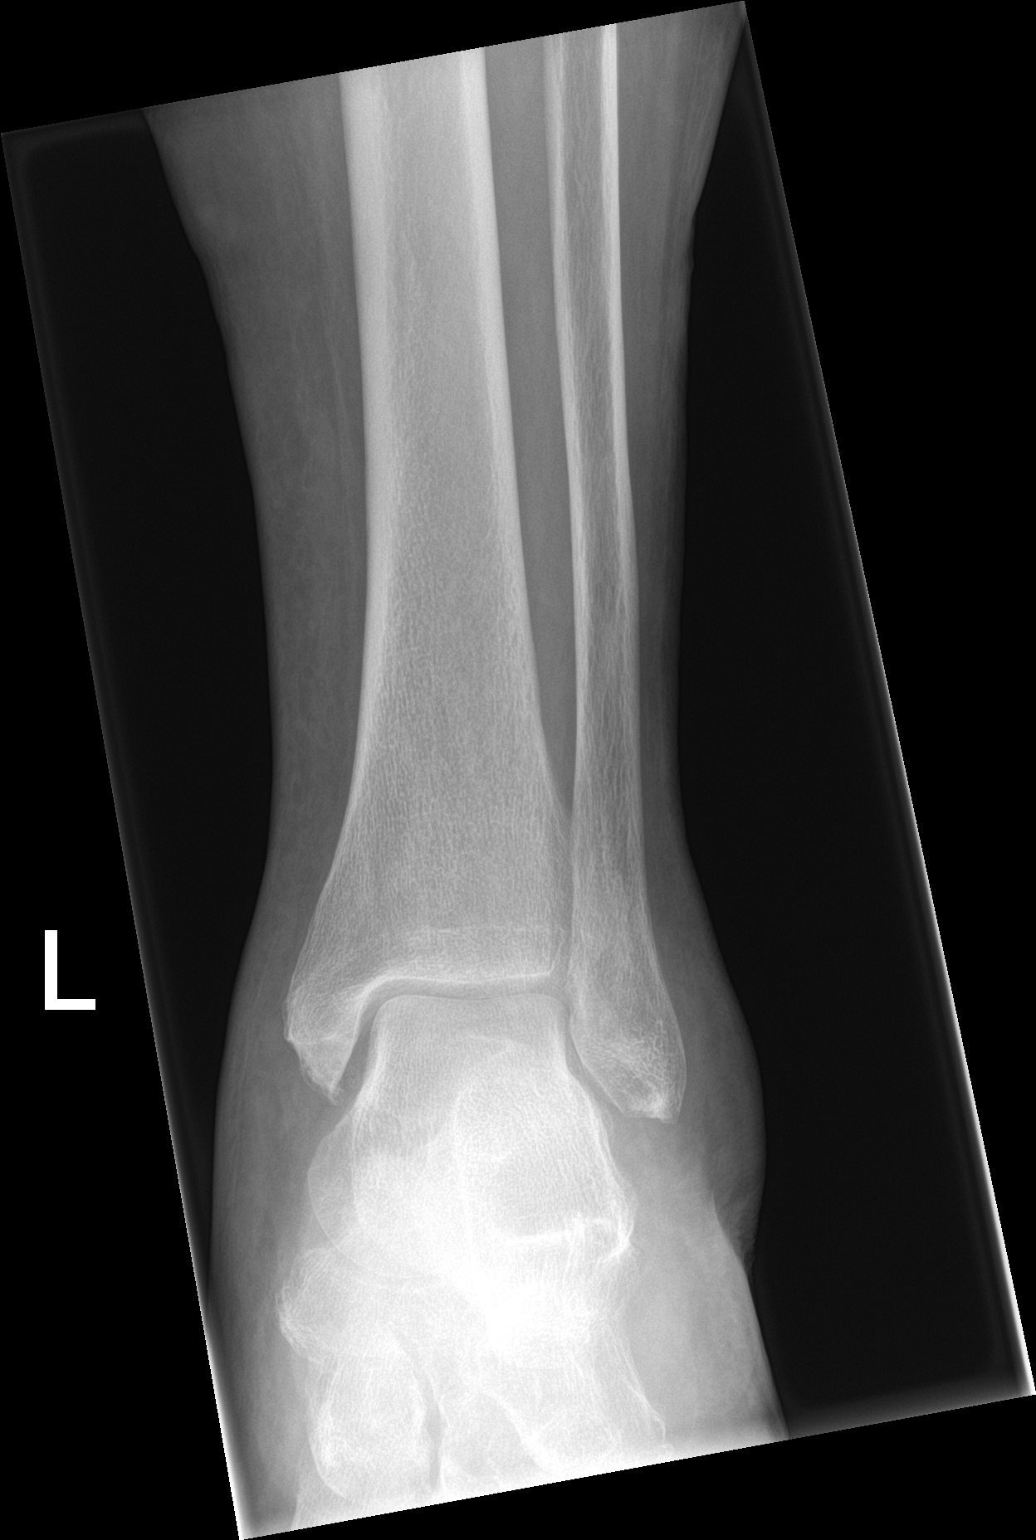

[ankle obl]
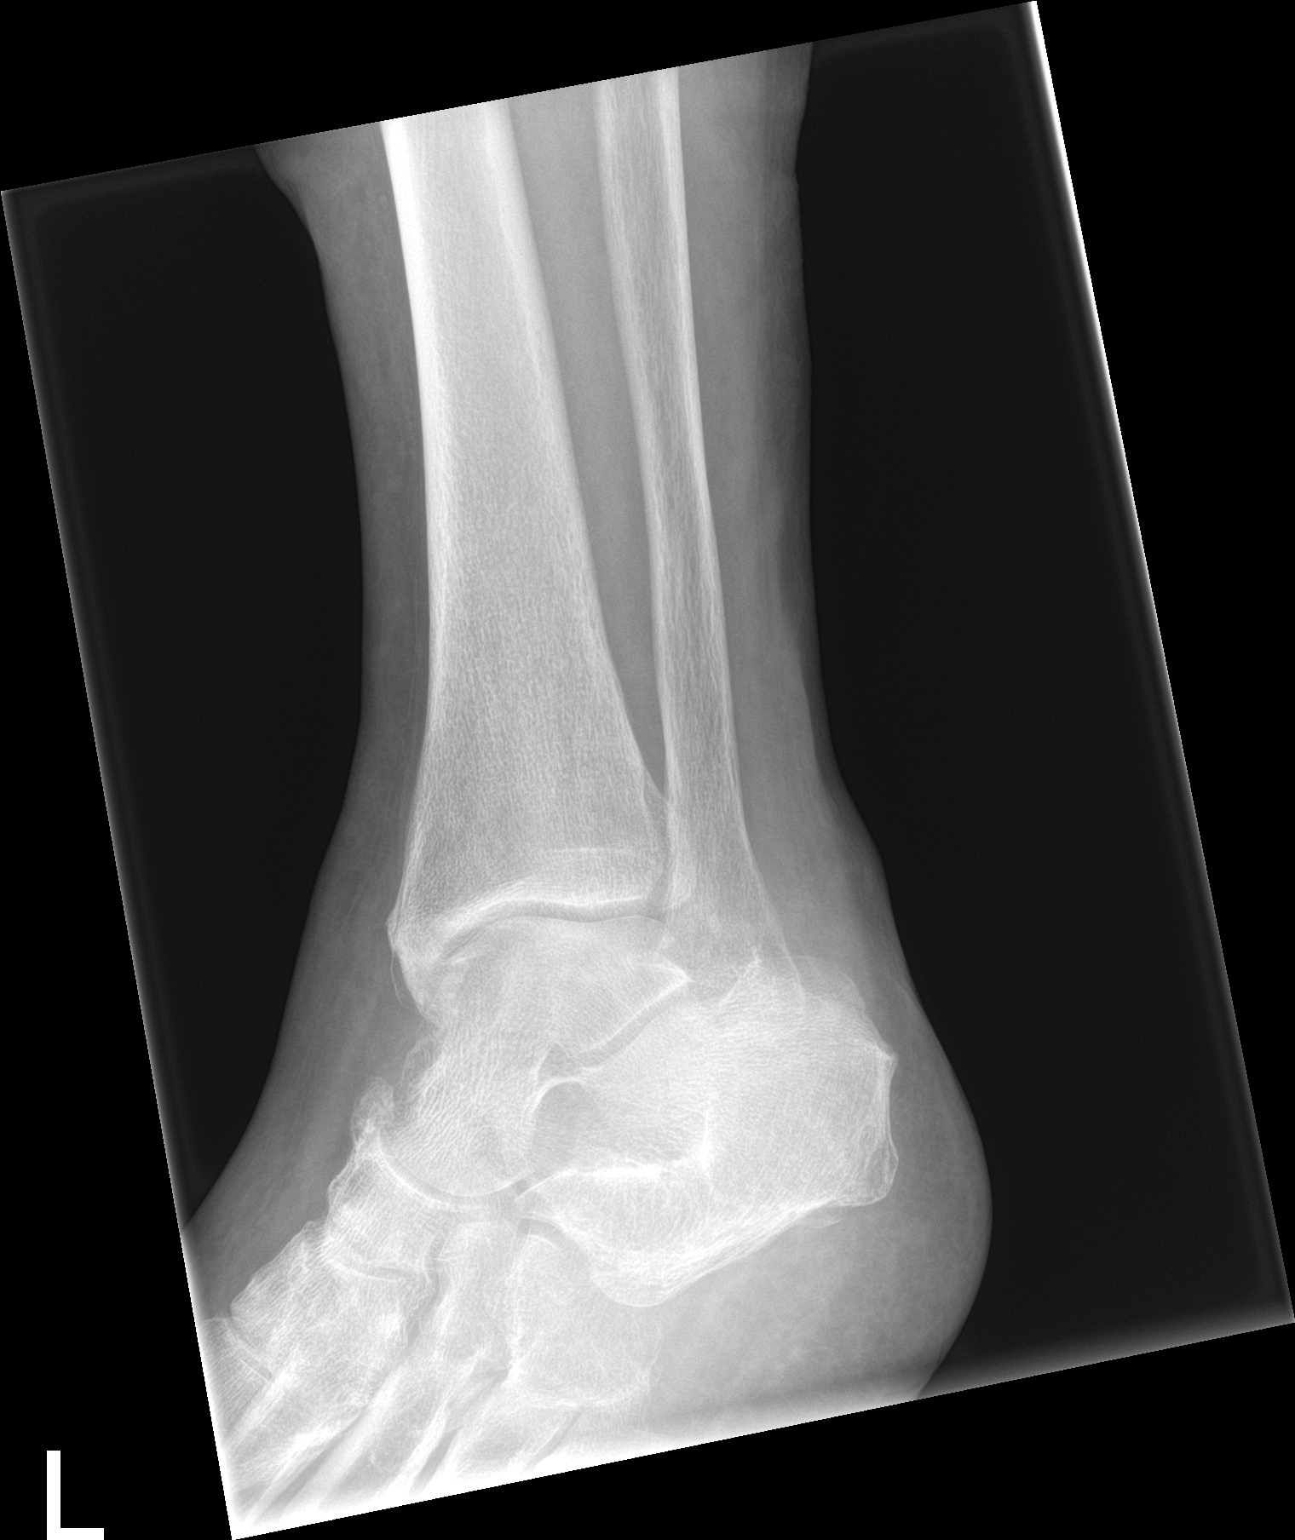

[ankle lat]
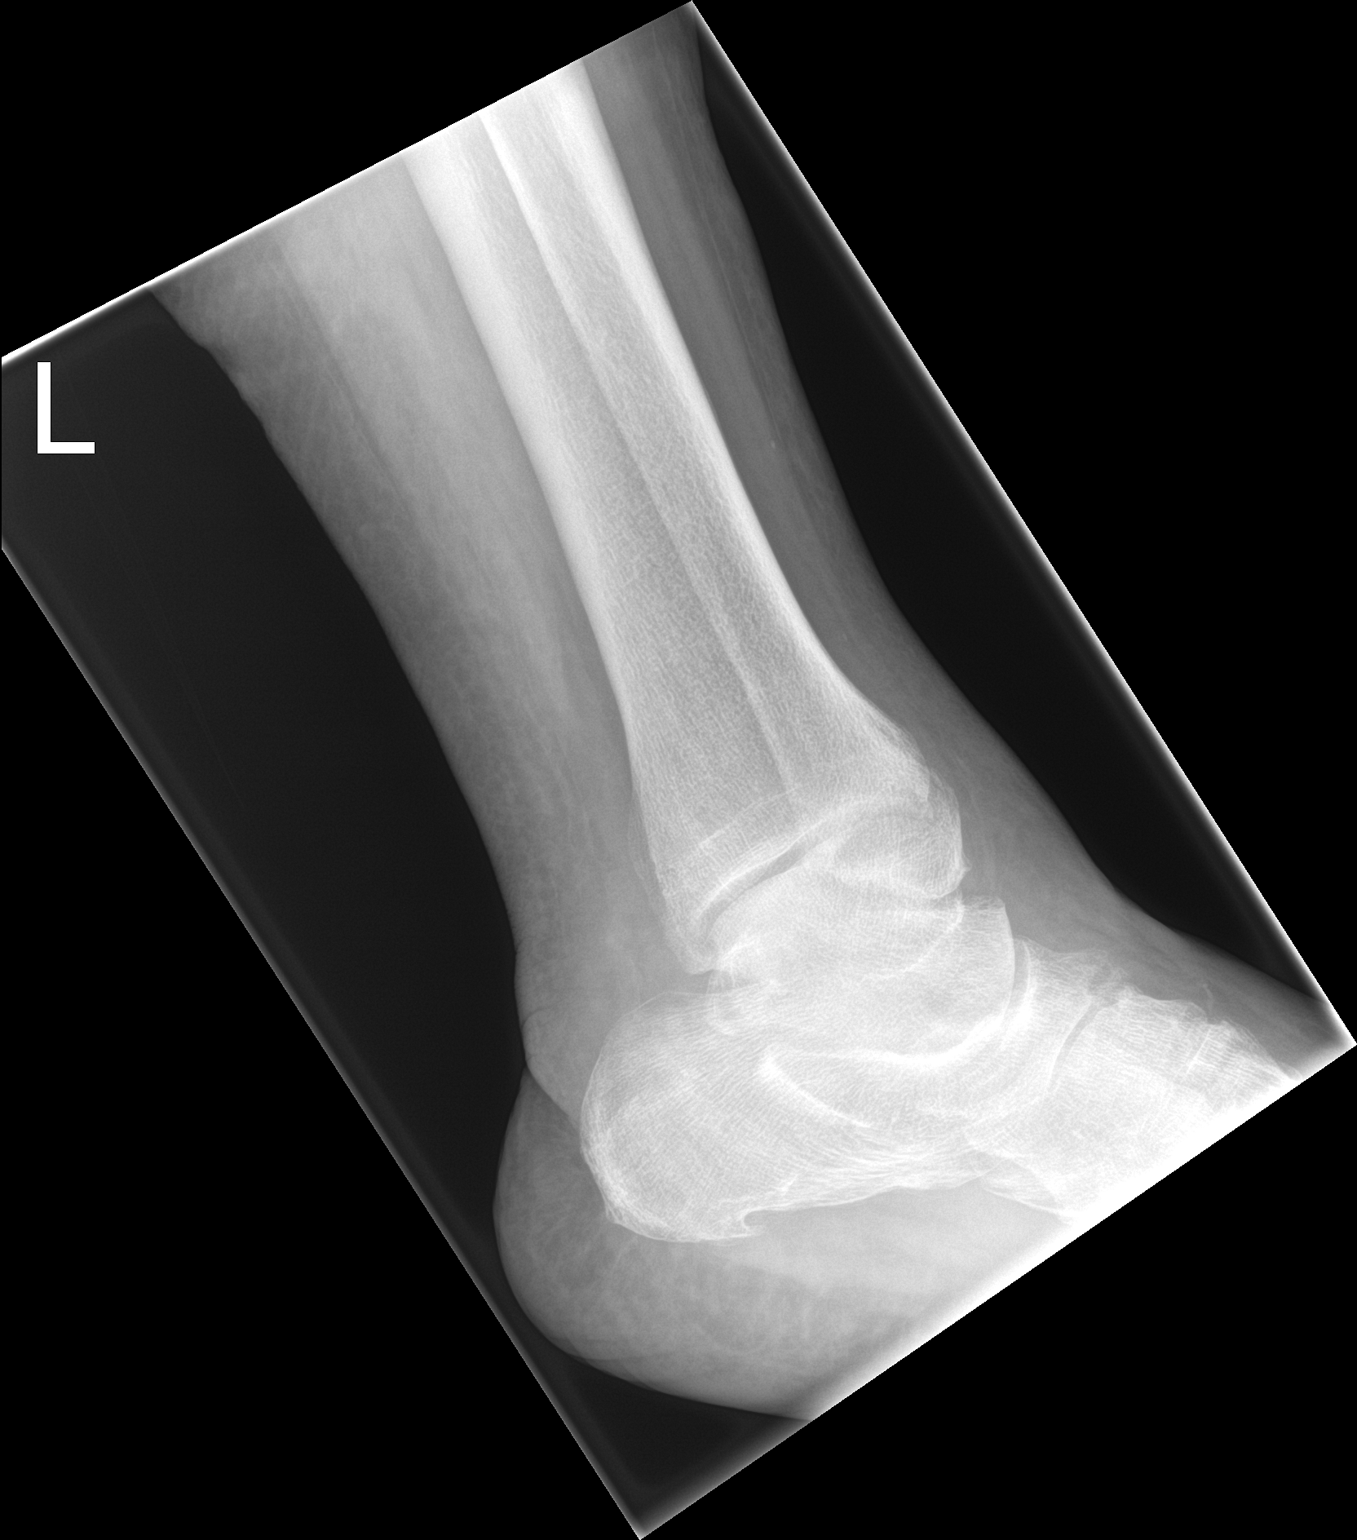

[3 of 3 positions shown; findings below may reference images not displayed]

FINDINGS: Diffuse ankle soft tissue swelling. Limited lateral view because of
positioning. No gross malalignment or fracture. Distal tibia,
fibula, talus and calcaneus appear intact.
IMPRESSION: Soft tissue swelling without acute osseous finding
